# Patient Record
Sex: Female | Born: 1970 | Race: Black or African American | Hispanic: No | Marital: Married | Smoking: Never smoker
Health system: Southern US, Community
[De-identification: ages and names within clinical notes are randomized; demographics above are authoritative.]

## PROBLEM LIST (undated history)

## (undated) DIAGNOSIS — I10 Essential (primary) hypertension: Secondary | ICD-10-CM

## (undated) DIAGNOSIS — G039 Meningitis, unspecified: Secondary | ICD-10-CM

## (undated) HISTORY — DX: Essential (primary) hypertension: I10

## (undated) HISTORY — DX: Meningitis, unspecified: G03.9

## (undated) HISTORY — PX: OTHER SURGICAL HISTORY: SHX169

---
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
## 2000-07-16 ENCOUNTER — Other Ambulatory Visit: Admission: RE | Admit: 2000-07-16 | Discharge: 2000-07-16 | Payer: Self-pay | Admitting: Obstetrics and Gynecology

## 2001-02-09 ENCOUNTER — Inpatient Hospital Stay (HOSPITAL_COMMUNITY): Admission: AD | Admit: 2001-02-09 | Discharge: 2001-02-12 | Payer: Self-pay

## 2001-07-09 ENCOUNTER — Other Ambulatory Visit: Admission: RE | Admit: 2001-07-09 | Discharge: 2001-07-09 | Payer: Self-pay | Admitting: Obstetrics and Gynecology

## 2002-07-09 ENCOUNTER — Other Ambulatory Visit: Admission: RE | Admit: 2002-07-09 | Discharge: 2002-07-09 | Payer: Self-pay | Admitting: Obstetrics and Gynecology

## 2003-07-24 ENCOUNTER — Other Ambulatory Visit: Admission: RE | Admit: 2003-07-24 | Discharge: 2003-07-24 | Payer: Self-pay | Admitting: Obstetrics and Gynecology

## 2004-09-01 ENCOUNTER — Other Ambulatory Visit: Admission: RE | Admit: 2004-09-01 | Discharge: 2004-09-01 | Payer: Self-pay | Admitting: Obstetrics and Gynecology

## 2005-11-15 ENCOUNTER — Other Ambulatory Visit: Admission: RE | Admit: 2005-11-15 | Discharge: 2005-11-15 | Payer: Self-pay | Admitting: Obstetrics and Gynecology

## 2006-10-21 ENCOUNTER — Emergency Department (HOSPITAL_COMMUNITY): Admission: EM | Admit: 2006-10-21 | Discharge: 2006-10-21 | Payer: Self-pay | Admitting: *Deleted
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
## 2010-11-10 LAB — HEPATIC FUNCTION PANEL
ALT: 10
AST: 14
Bilirubin, Direct: <0.1
Total Bilirubin: 0.5

## 2010-11-10 LAB — I-STAT 8, (EC8 V) (CONVERTED LAB)
Acid-base deficit: 2
HCT: 39
Operator id: 257131
Potassium: 3.4 — ABNORMAL LOW
TCO2: 23
pCO2, Ven: 34.4 — ABNORMAL LOW
pH, Ven: 7.412 — ABNORMAL HIGH

## 2010-11-10 LAB — POCT I-STAT CREATININE
Creatinine, Ser: 0.8
Operator id: 257131

## 2010-11-10 LAB — POCT CARDIAC MARKERS: Myoglobin, poc: 40.1

## 2011-03-01 ENCOUNTER — Other Ambulatory Visit (HOSPITAL_COMMUNITY)
Admission: RE | Admit: 2011-03-01 | Discharge: 2011-03-01 | Disposition: A | Discharge status: Home / Self Care | Payer: Self-pay | Source: Ambulatory Visit | Attending: Family Medicine | Admitting: Family Medicine

## 2011-03-01 ENCOUNTER — Other Ambulatory Visit: Payer: Self-pay | Admitting: Physician Assistant

## 2011-03-01 DIAGNOSIS — Z01419 Encounter for gynecological examination (general) (routine) without abnormal findings: Secondary | ICD-10-CM | POA: Insufficient documentation

## 2012-03-01 ENCOUNTER — Other Ambulatory Visit: Payer: Self-pay | Admitting: Physician Assistant

## 2012-03-01 ENCOUNTER — Other Ambulatory Visit (HOSPITAL_COMMUNITY)
Admission: RE | Admit: 2012-03-01 | Discharge: 2012-03-01 | Disposition: A | Discharge status: Home / Self Care | Payer: No Typology Code available for payment source | Source: Ambulatory Visit | Attending: Family Medicine | Admitting: Family Medicine

## 2012-03-01 DIAGNOSIS — Z124 Encounter for screening for malignant neoplasm of cervix: Secondary | ICD-10-CM | POA: Insufficient documentation

## 2013-04-16 ENCOUNTER — Other Ambulatory Visit (HOSPITAL_COMMUNITY)
Admission: RE | Admit: 2013-04-16 | Discharge: 2013-04-16 | Disposition: A | Discharge status: Home / Self Care | Payer: No Typology Code available for payment source | Source: Ambulatory Visit | Attending: Family Medicine | Admitting: Family Medicine

## 2013-04-16 ENCOUNTER — Other Ambulatory Visit: Payer: Self-pay | Admitting: Physician Assistant

## 2013-04-16 DIAGNOSIS — Z124 Encounter for screening for malignant neoplasm of cervix: Secondary | ICD-10-CM | POA: Insufficient documentation

## 2014-04-21 ENCOUNTER — Other Ambulatory Visit: Payer: Self-pay | Admitting: Physician Assistant

## 2014-04-21 ENCOUNTER — Other Ambulatory Visit (HOSPITAL_COMMUNITY)
Admission: RE | Admit: 2014-04-21 | Discharge: 2014-04-21 | Disposition: A | Discharge status: Home / Self Care | Payer: No Typology Code available for payment source | Source: Ambulatory Visit | Attending: Family Medicine | Admitting: Family Medicine

## 2014-04-21 DIAGNOSIS — Z124 Encounter for screening for malignant neoplasm of cervix: Secondary | ICD-10-CM | POA: Insufficient documentation

## 2014-04-23 LAB — CYTOLOGY - PAP

## 2015-01-31 HISTORY — PX: LIPOSUCTION: SHX10

## 2015-04-22 ENCOUNTER — Other Ambulatory Visit: Payer: Self-pay | Admitting: Physician Assistant

## 2015-04-22 ENCOUNTER — Other Ambulatory Visit (HOSPITAL_COMMUNITY)
Admission: RE | Admit: 2015-04-22 | Discharge: 2015-04-22 | Disposition: A | Discharge status: Home / Self Care | Payer: BC Managed Care – PPO | Source: Ambulatory Visit | Attending: Family Medicine | Admitting: Family Medicine

## 2015-04-22 DIAGNOSIS — Z124 Encounter for screening for malignant neoplasm of cervix: Secondary | ICD-10-CM | POA: Diagnosis not present

## 2015-04-27 LAB — CYTOLOGY - PAP

## 2015-05-12 ENCOUNTER — Other Ambulatory Visit: Payer: Self-pay

## 2015-05-12 DIAGNOSIS — Z1231 Encounter for screening mammogram for malignant neoplasm of breast: Secondary | ICD-10-CM

## 2015-05-28 ENCOUNTER — Ambulatory Visit
Admission: RE | Admit: 2015-05-28 | Discharge: 2015-05-28 | Disposition: A | Discharge status: Home / Self Care | Payer: BC Managed Care – PPO | Source: Ambulatory Visit

## 2015-05-28 DIAGNOSIS — Z1231 Encounter for screening mammogram for malignant neoplasm of breast: Secondary | ICD-10-CM

## 2015-06-23 ENCOUNTER — Ambulatory Visit
Admission: RE | Admit: 2015-06-23 | Discharge: 2015-06-23 | Disposition: A | Discharge status: Home / Self Care | Payer: BC Managed Care – PPO | Source: Ambulatory Visit | Attending: Physician Assistant | Admitting: Physician Assistant

## 2015-06-23 ENCOUNTER — Other Ambulatory Visit: Payer: Self-pay | Admitting: Physician Assistant

## 2015-06-23 DIAGNOSIS — R1084 Generalized abdominal pain: Secondary | ICD-10-CM

## 2015-06-23 MED ORDER — IOPAMIDOL (ISOVUE-300) INJECTION 61%
100.0000 mL | Freq: Once | INTRAVENOUS | Status: AC | PRN
  Administered 2015-06-23: 100 mL via INTRAVENOUS

## 2015-06-29 ENCOUNTER — Other Ambulatory Visit: Payer: BC Managed Care – PPO

## 2016-05-03 ENCOUNTER — Other Ambulatory Visit: Payer: Self-pay | Admitting: Physician Assistant

## 2016-05-03 DIAGNOSIS — Z1231 Encounter for screening mammogram for malignant neoplasm of breast: Secondary | ICD-10-CM

## 2016-05-29 ENCOUNTER — Ambulatory Visit
Admission: RE | Admit: 2016-05-29 | Discharge: 2016-05-29 | Disposition: A | Discharge status: Home / Self Care | Payer: BC Managed Care – PPO | Source: Ambulatory Visit | Attending: Physician Assistant | Admitting: Physician Assistant

## 2016-05-29 DIAGNOSIS — Z1231 Encounter for screening mammogram for malignant neoplasm of breast: Secondary | ICD-10-CM

## 2017-05-03 ENCOUNTER — Other Ambulatory Visit: Payer: Self-pay | Admitting: Physician Assistant

## 2017-05-03 DIAGNOSIS — Z1231 Encounter for screening mammogram for malignant neoplasm of breast: Secondary | ICD-10-CM

## 2017-05-30 ENCOUNTER — Ambulatory Visit
Admission: RE | Admit: 2017-05-30 | Discharge: 2017-05-30 | Disposition: A | Discharge status: Home / Self Care | Payer: BC Managed Care – PPO | Source: Ambulatory Visit | Attending: Physician Assistant | Admitting: Physician Assistant

## 2017-05-30 DIAGNOSIS — Z1231 Encounter for screening mammogram for malignant neoplasm of breast: Secondary | ICD-10-CM

## 2017-05-30 IMAGING — MG DIGITAL SCREENING BILATERAL MAMMOGRAM WITH TOMO AND CAD
8 series · 8 of 24 positions shown · non-contrast
Comparison: Previous exam(s).

CLINICAL DATA: Screening.

EXAM:
DIGITAL SCREENING BILATERAL MAMMOGRAM WITH TOMO AND CAD

[L CC synth-2D]
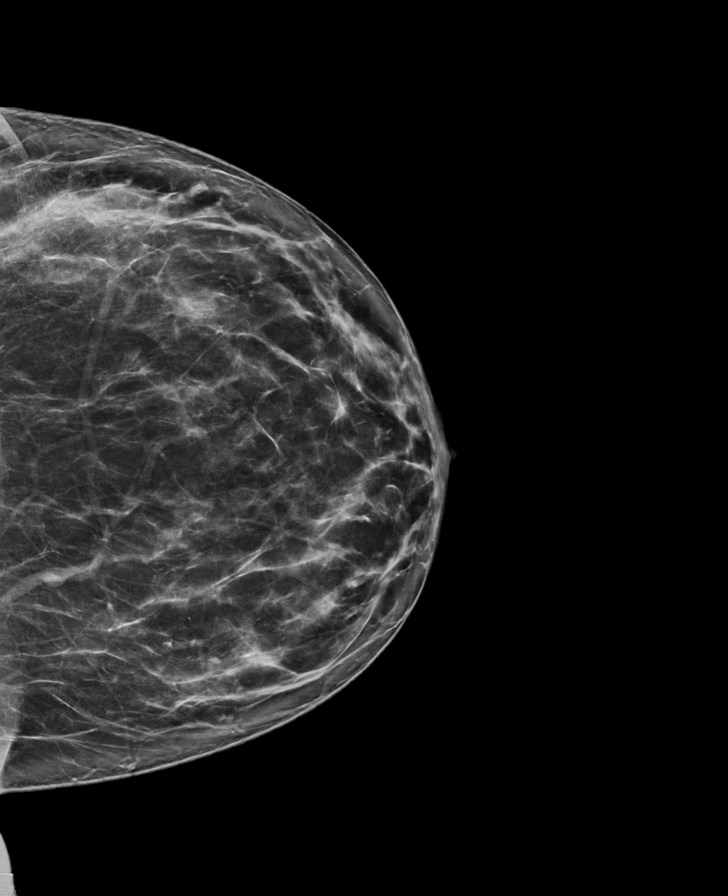

[R CC synth-2D]
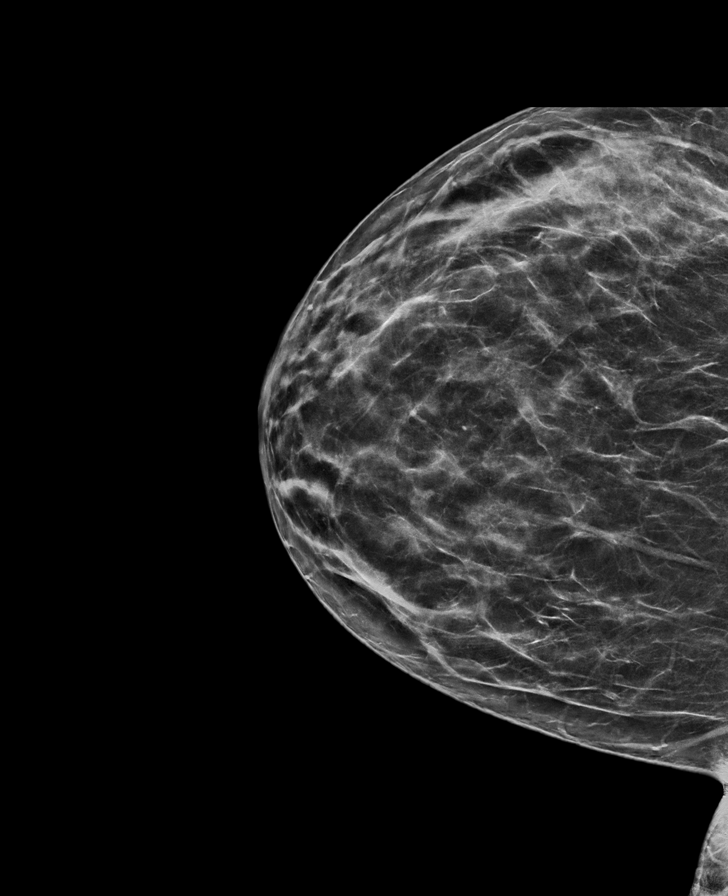

[L MLO synth-2D]
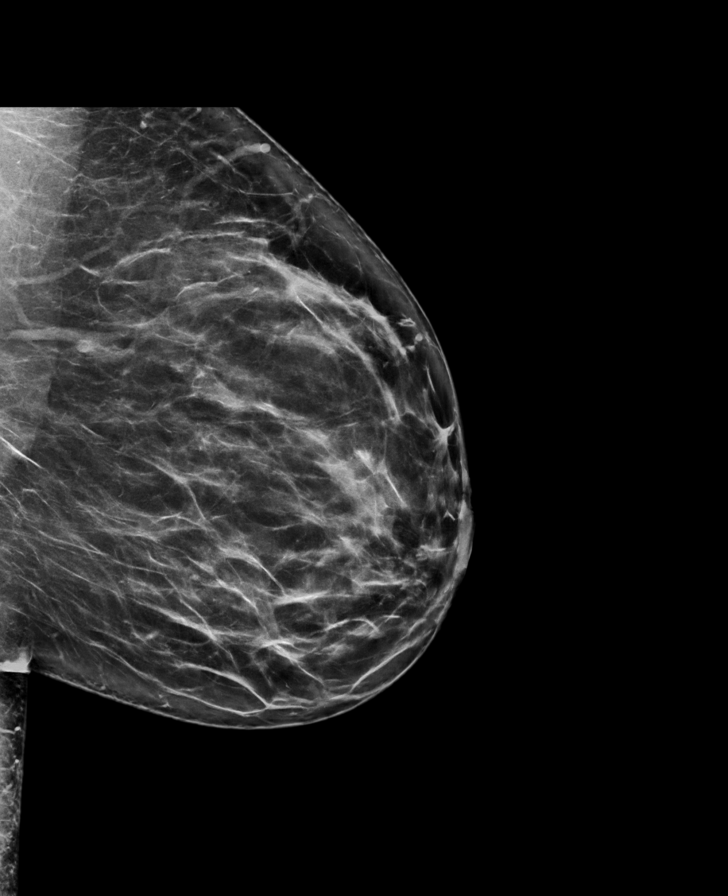

[R MLO synth-2D]
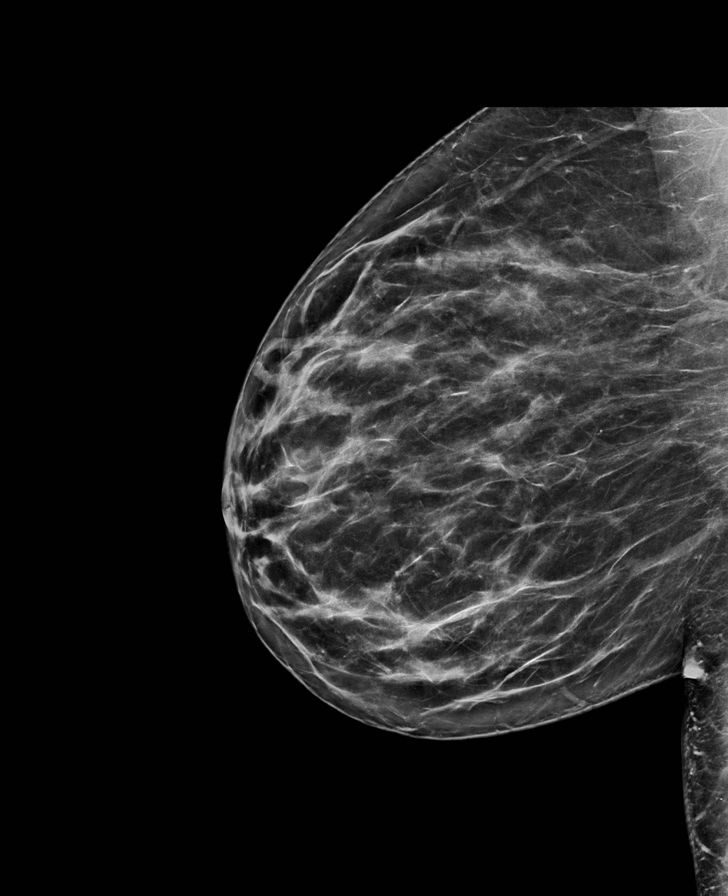

[R CC tomo · tomo slice 38/75.0]
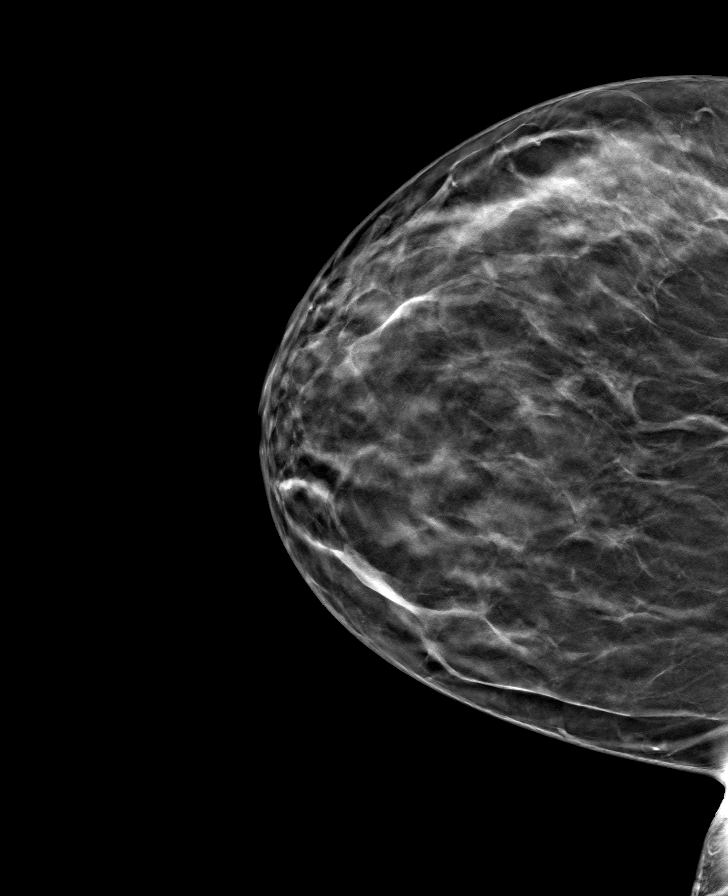

[R MLO tomo · tomo slice 43/84.0]
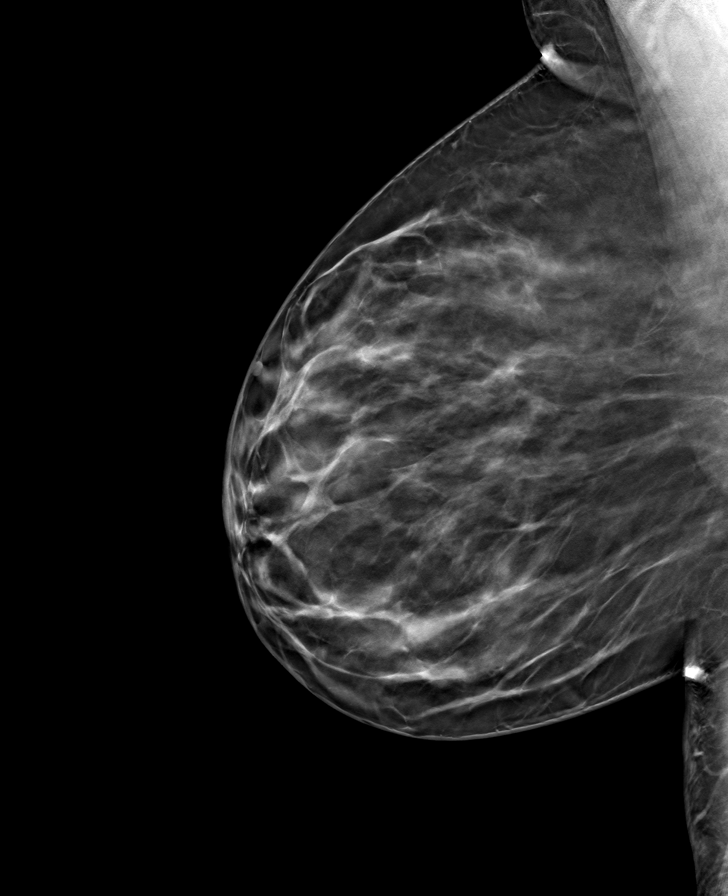

[L CC tomo · tomo slice 42/83.0]
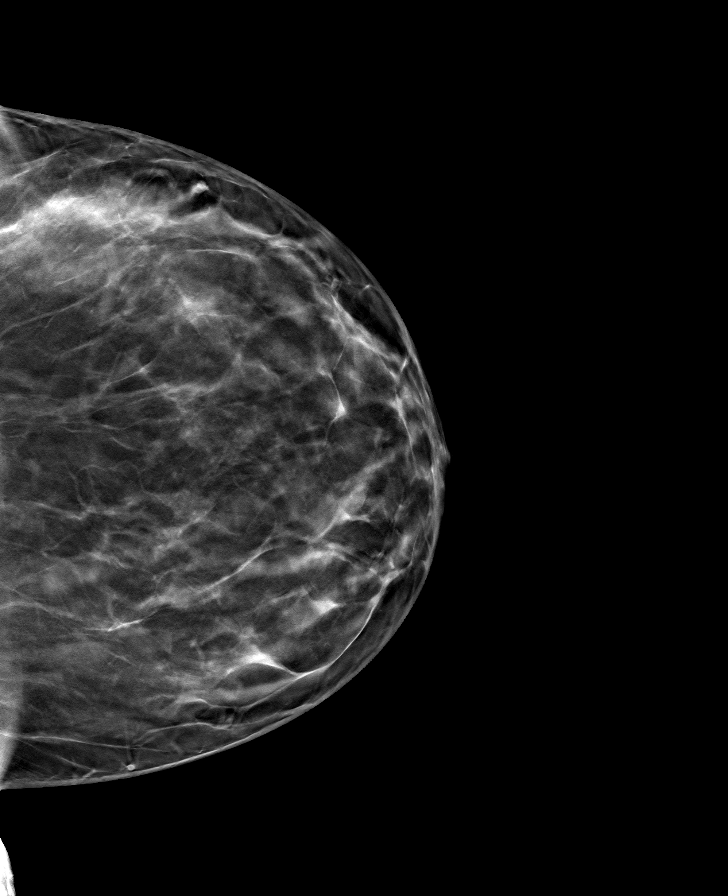

[L MLO tomo · tomo slice 45/89.0]
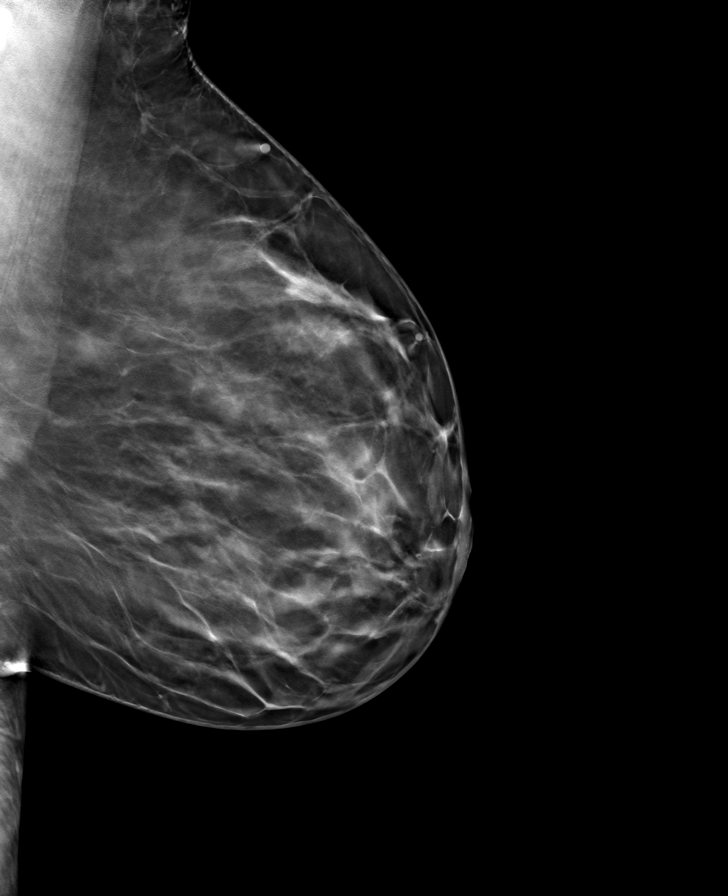

[8 of 24 positions shown; findings below may reference images not displayed]

ACR Breast Density Category c: The breast tissue is heterogeneously
dense, which may obscure small masses.
FINDINGS: There are no findings suspicious for malignancy. Images were
processed with CAD.
IMPRESSION: No mammographic evidence of malignancy. A result letter of this
screening mammogram will be mailed directly to the patient.

RECOMMENDATION:
Screening mammogram in one year. (Code:[5V])

BI-RADS CATEGORY  1: Negative.

## 2017-10-15 ENCOUNTER — Encounter: Payer: Self-pay | Admitting: Allergy

## 2017-10-15 ENCOUNTER — Ambulatory Visit: Payer: BC Managed Care – PPO | Admitting: Allergy

## 2017-10-15 VITALS — BP 126/78 | HR 94 | Temp 98.5°F | Resp 17 | Ht 65.0 in | Wt 219.6 lb

## 2017-10-15 DIAGNOSIS — T781XXA Other adverse food reactions, not elsewhere classified, initial encounter: Secondary | ICD-10-CM

## 2017-10-15 DIAGNOSIS — L508 Other urticaria: Secondary | ICD-10-CM | POA: Diagnosis not present

## 2017-10-15 DIAGNOSIS — T783XXA Angioneurotic edema, initial encounter: Secondary | ICD-10-CM | POA: Diagnosis not present

## 2017-10-15 DIAGNOSIS — J310 Chronic rhinitis: Secondary | ICD-10-CM | POA: Diagnosis not present

## 2017-10-15 MED ORDER — FAMOTIDINE 20 MG PO TABS: 20.0000 mg | ORAL_TABLET | Freq: Two times a day (BID) | ORAL | 5 refills | Status: DC

## 2017-10-15 MED ORDER — MONTELUKAST SODIUM 10 MG PO TABS: 10.0000 mg | ORAL_TABLET | Freq: Every day | ORAL | 5 refills | Status: DC

## 2017-10-15 MED ORDER — FEXOFENADINE HCL 180 MG PO TABS: 180.0000 mg | ORAL_TABLET | Freq: Two times a day (BID) | ORAL | 5 refills | Status: DC

## 2017-10-15 MED ORDER — EPINEPHRINE 0.3 MG/0.3ML IJ SOAJ: 0.3000 mg | Freq: Once | INTRAMUSCULAR | 2 refills | Status: AC

## 2017-10-15 NOTE — Patient Instructions (Signed)
Hives and swelling  - at this time etiology of hives and swelling is unknown.  Hives can be caused by a variety of different triggers including illness/infection, foods, medications, stings, exercise, pressure, vibrations, extremes of temperature to name a few however majority of the time there is no identifiable trigger.  You do have physical component of hives/swelling including exercise and pressure induced.  Your symptoms have been ongoing for >6 weeks making this chronic thus will obtain labwork to evaluate: CBC w diff, CMP, tryptase, hive panel, environmental panel, alpha-gal panel - increase fexofenadine 180mg  to 1 tablet twice a day - start Pepcid 20mg  1 tablet twice a day - start singulair 10mg  1 tablet daily - we did discuss Xolair monthly injections to help with management of hives and swelling if above medications are not effective at controlling hives/swelling episodes.  Brochure provided today.  - for current lip swelling provided with prednisone 20mg  to take for next 3 days.    Nasal drip - as above will obtain environmental allergen panel - for nasal drainage/congestion will have you try Dymista.  Trial dymista 1 spray each nostril twice a day.  This is a combination nasal spray with Flonase + Astelin (nasal antihistamine).  This helps with both nasal congestion and drainage.  If insurance does not cover then will prescribe Astelin separately.   Flonase is OTC nasal steroid spray.    Adverse food reaction - symptoms following [place] nut exposure.  Will obtain serum IgE to tree nut panel. - continue avoidance of [place] nuts - would recommend to have access to an epinephrine device (Epipen or Auviq) in case of more severe reaction if exposure to [place] nuts.   If starting Xolair will need epinephrine device for these injections as well.      Follow-up 2 months or sooner if needed

## 2017-10-15 NOTE — Progress Notes (Signed)
New Patient Note  RE: Selena Collins MRN: [id number] DOB: 1970/04/29 Date of Office Visit: 10/15/2017  Referring provider: Milus Height, PA-C Primary care provider: Milus Height, PA-C  Chief Complaint: Hives  History of present illness: Selena Collins is a 47 y.o. female presenting today for consultation for urticaria and angioedema.  Starting in May 2019 she started developing hives and swelling episodes.   She states she has noticed swelling especially of her lips and periorbitally.  The hives can appear anywhere on the body.  She has been having symptoms pretty much daily. She has noticed several triggers of her hives including exercise.  She has been doing "black girls run" a group that meets and walks/runs in the mornings.  She has noticed that every time she attends these sessions that the hives and swelling seems to appear.  She went early this morning for the walk and afterwards she noticed that her under eye was swollen as well as her lower lip became swollen as well.  She also has noticed that when she becomes overheated or in hot environments and pressure also appeared to be triggers Hives last about a hour or so before resolving.  She does feel she is having some hyperpigmentation on her legs when the hives resolved.  She denies any fevers of the hives.  She denies any joint aches or pains of the hives.  She denies any history of sting's, new foods or change in foods, new medications or change in soaps/lotions/detergents prior to onset of the hives and swelling.  She does state that she has been having some URI symptoms including nasal congestion prior to the onset of the hives and swelling. She states she has tried use of Claritin and more recently has been taking fexofenadine.  She states that she does not take the fexofenadine that the hives and swelling are much worse.  She is just been taking 1 tablet a day.  She also tried taking Benadryl but states that this was not as  effective as the fexofenadine has been.  She has seen her PCP for the hives and swelling and has received per patient a steroid injection.  She states this steroid lasted for about a week before the hives and swelling returned again.  She does report constant nasal drip that she has had for years.  She feels like there is always something in her throat that she is trying to clear out.  She has tried OTC nasal spray that did help but states she does not remember to take this more consistently.    No history of asthma.  She states having eczema during her teen years.   About a year ago she was eating nuts and fruit and she developed lip swelling.   She states when she was a child [place] nut made her mouth itch.  She believes that there may have been traces of [place] nut in this nut mix she was eating a year ago that caused the lip swelling.   She just avoids [place] nuts.  She eats tree nuts/peanuts "all the time" without any issue.  She does not have an epinephrine device.  Review of systems: Review of Systems  Constitutional: Negative for chills, fever and malaise/fatigue.  HENT: Positive for congestion. Negative for ear discharge, ear pain, nosebleeds, sinus pain and sore throat.   Eyes: Negative for pain, discharge and redness.  Respiratory: Negative for cough, shortness of breath and wheezing.   Cardiovascular: Negative for chest  pain.  Gastrointestinal: Negative for abdominal pain, constipation, diarrhea, heartburn, nausea and vomiting.  Musculoskeletal: Negative for joint pain.  Skin: Positive for itching and rash.  Neurological: Negative for headaches.    All other systems negative unless noted above in HPI  Past medical history: Past Medical History:  Diagnosis Date  . Meningitis 1988    Past surgical history: History reviewed. No pertinent surgical history.  Family history:  Family History  Problem Relation Age of Onset  . Urticaria Mother   . Allergic rhinitis Sister      Social history: She lives in a home with carpeting with gas and electric heating and central cooling.  There are no pets in the home.  There is no concern for water damage, mildew or roaches in the home.  She is a Therapist, occupational.  She denies a smoking history.  Medication List: Allergies as of 10/15/2017   No Known Allergies     Medication List        Accurate as of 10/15/17  1:25 PM. Always use your most recent med list.          ADVIL 200 MG tablet Generic drug:  ibuprofen Take 400 mg by mouth every 6 (six) hours as needed.   BENADRYL ALLERGY 25 mg capsule Generic drug:  diphenhydrAMINE Take 25 mg by mouth every 6 (six) hours as needed.   famotidine 20 MG tablet Commonly known as:  PEPCID Take 1 tablet (20 mg total) by mouth 2 (two) times daily.   fexofenadine 180 MG tablet Commonly known as:  ALLEGRA Take 1 tablet (180 mg total) by mouth 2 (two) times daily.   meloxicam 15 MG tablet Commonly known as:  MOBIC TAKE 1 TABLET BY MOUTH ONCE DAILY AS NEEDED WITH FOOD FOR PAIN AND SWELLING   Norgestimate-Ethinyl Estradiol Triphasic 0.18/0.215/0.25 MG-35 MCG tablet Take by mouth.       Known medication allergies: No Known Allergies   Physical examination: Blood pressure 126/78, pulse 94, temperature 98.5 F (36.9 C), temperature source Oral, resp. rate 17, height 5\' 5"  (1.651 m), weight 219 lb 9.6 oz (99.6 kg), SpO2 98 %.  General: Alert, interactive, in no acute distress. HEENT: PERRLA, TMs pearly gray, turbinates mildly edematous without discharge, post-pharynx non erythematous.  There is significant edema of her lower lip concentrated more so on the left lower lip.  There is no oral pharyngeal edema.  There is mild periorbital edema of the lower left lid  Neck: Supple without lymphadenopathy. Lungs: Clear to auscultation without wheezing, rhonchi or rales. {no increased work of breathing. CV: Normal S1, S2 without murmurs. Abdomen: Nondistended,  nontender. Skin: Scattered erythematous urticarial type lesions primarily located Thighs, legs, arms bilaterally , nonvesicular. Extremities:  No clubbing, cyanosis or edema. Neuro:   Grossly intact.  Diagnositics/Labs:  Allergy testing: Deferred due to ongoing urticaria  Assessment and plan:   Chronic urticaria with angioedema - at this time etiology of hives and swelling is unknown.  Hives can be caused by a variety of different triggers including illness/infection, foods, medications, stings, exercise, pressure, vibrations, extremes of temperature to name a few however majority of the time there is no identifiable trigger.  You do have physical component of hives/swelling including exercise and pressure induced.  Your symptoms have been ongoing for >6 weeks making this chronic thus will obtain labwork to evaluate: CBC w diff, CMP, tryptase, hive panel, environmental panel, alpha-gal panel - increase fexofenadine 180mg  to 1 tablet twice a day - start Pepcid 20mg   1 tablet twice a day - start singulair 10mg  1 tablet daily - we did discuss Xolair monthly injections to help with management of hives and swelling if above medications are not effective at controlling hives/swelling episodes.  Brochure provided today.  - for current lip swelling provided with prednisone 20mg  to take for next 3 days.    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  Trial dymista 1 spray each nostril twice a day.  This is a combination nasal spray with Flonase + Astelin (nasal antihistamine).  This helps with both nasal congestion and drainage.  If insurance does not cover then will prescribe Astelin separately.   Flonase is OTC nasal steroid spray.   -Antihistamine as above  Adverse food reaction - symptoms following [place] nut exposure.  Will obtain serum IgE to tree nut panel. - continue avoidance of [place] nuts -  would recommend to have access to an epinephrine device (Epipen or Auviq) in case of more severe reaction if exposure to [place] nuts.   If starting Xolair will need epinephrine device for these injections as well.      Follow-up 2 months or sooner if needed  I appreciate the opportunity to take part in Dandria's care. Please do not hesitate to contact me with questions.  Sincerely,   Margo AyeShaylar Padgett, MD Allergy/Immunology [facility]

## 2017-10-20 LAB — COMPREHENSIVE METABOLIC PANEL
ALBUMIN: 4.4 g/dL (ref 3.5–5.5)
ALK PHOS: 63 IU/L (ref 39–117)
ALT: 10 IU/L (ref 0–32)
AST: 16 IU/L (ref 0–40)
Albumin/Globulin Ratio: 1.5 (ref 1.2–2.2)
BILIRUBIN TOTAL: 0.4 mg/dL (ref 0.0–1.2)
BUN / CREAT RATIO: 12 (ref 9–23)
BUN: 10 mg/dL (ref 6–24)
CO2: 21 mmol/L (ref 20–29)
Calcium: 9.5 mg/dL (ref 8.7–10.2)
Chloride: 103 mmol/L (ref 96–106)
Creatinine, Ser: 0.83 mg/dL (ref 0.57–1.00)
GFR calc Af Amer: 96 mL/min/{1.73_m2} (ref >59)
GFR calc non Af Amer: 84 mL/min/{1.73_m2} (ref >59)
GLUCOSE: 79 mg/dL (ref 65–99)
Globulin, Total: 2.9 g/dL (ref 1.5–4.5)
Potassium: 4.2 mmol/L (ref 3.5–5.2)
SODIUM: 140 mmol/L (ref 134–144)
Total Protein: 7.3 g/dL (ref 6.0–8.5)

## 2017-10-20 LAB — ALPHA-GAL PANEL
Beef (Bos spp) IgE: <0.1 kU/L (ref <0.35)
Class Interpretation: 0
LAMB CLASS INTERPRETATION: 0
Lamb/Mutton (Ovis spp) IgE: <0.1 kU/L (ref <0.35)
PORK CLASS INTERPRETATION: 0

## 2017-10-20 LAB — CBC WITH DIFFERENTIAL/PLATELET
BASOS ABS: 0 10*3/uL (ref 0.0–0.2)
Basos: 0 %
EOS (ABSOLUTE): 0.1 10*3/uL (ref 0.0–0.4)
Eos: 1 %
HEMATOCRIT: 42 % (ref 34.0–46.6)
Hemoglobin: 13.7 g/dL (ref 11.1–15.9)
Immature Grans (Abs): 0 10*3/uL (ref 0.0–0.1)
Immature Granulocytes: 0 %
LYMPHS ABS: 2 10*3/uL (ref 0.7–3.1)
Lymphs: 22 %
MCH: 27.7 pg (ref 26.6–33.0)
MCHC: 32.6 g/dL (ref 31.5–35.7)
MCV: 85 fL (ref 79–97)
Monocytes Absolute: 0.5 10*3/uL (ref 0.1–0.9)
Monocytes: 5 %
NEUTROS PCT: 72 %
Neutrophils Absolute: 6.7 10*3/uL (ref 1.4–7.0)
Platelets: 363 10*3/uL (ref 150–450)
RBC: 4.95 x10E6/uL (ref 3.77–5.28)
RDW: 13.8 % (ref 12.3–15.4)
WBC: 9.4 10*3/uL (ref 3.4–10.8)

## 2017-10-20 LAB — ALLERGENS W/TOTAL IGE AREA 2
Alternaria Alternata IgE: <0.1 kU/L
COCKROACH, GERMAN IGE: 0.16 kU/L — AB
Cladosporium Herbarum IgE: <0.1 kU/L
D Farinae IgE: 16 kU/L — AB
D001-IGE D PTERONYSSINUS: 15.6 kU/L — AB
IGE (IMMUNOGLOBULIN E), SERUM: 121 [IU]/mL (ref 6–495)
Johnson Grass IgE: <0.1 kU/L
Maple/Box Elder IgE: <0.1 kU/L
Oak, White IgE: <0.1 kU/L
Pecan, Hickory IgE: <0.1 kU/L
Penicillium Chrysogen IgE: <0.1 kU/L
Pigweed, Rough IgE: <0.1 kU/L
Ragweed, Short IgE: 0.32 kU/L — AB
Sheep Sorrel IgE Qn: <0.1 kU/L
Timothy Grass IgE: 1.77 kU/L — AB
White Mulberry IgE: <0.1 kU/L

## 2017-10-20 LAB — ALLERGENS(7)
Brazil Nut IgE: <0.1 kU/L
F020-IgE Almond: <0.1 kU/L
Hazelnut (Filbert) IgE: 0.12 kU/L — AB
Walnut IgE: <0.1 kU/L

## 2017-10-20 LAB — THYROID ANTIBODIES
THYROID PEROXIDASE ANTIBODY: 9 [IU]/mL (ref 0–34)
Thyroglobulin Antibody: <1 IU/mL (ref 0.0–0.9)

## 2017-10-20 LAB — CHRONIC URTICARIA: CU INDEX: 32.1 — AB (ref <10)

## 2017-10-20 LAB — TRYPTASE: Tryptase: 5.6 ug/L (ref 2.2–13.2)

## 2017-10-23 ENCOUNTER — Encounter: Payer: Self-pay | Admitting: *Deleted

## 2018-05-01 ENCOUNTER — Other Ambulatory Visit: Payer: Self-pay | Admitting: Physician Assistant

## 2018-05-01 DIAGNOSIS — Z1231 Encounter for screening mammogram for malignant neoplasm of breast: Secondary | ICD-10-CM

## 2018-06-19 ENCOUNTER — Ambulatory Visit
Admission: RE | Admit: 2018-06-19 | Discharge: 2018-06-19 | Disposition: A | Discharge status: Home / Self Care | Payer: BC Managed Care – PPO | Source: Ambulatory Visit | Attending: Physician Assistant | Admitting: Physician Assistant

## 2018-06-19 ENCOUNTER — Other Ambulatory Visit: Payer: Self-pay

## 2018-06-19 DIAGNOSIS — Z1231 Encounter for screening mammogram for malignant neoplasm of breast: Secondary | ICD-10-CM

## 2018-06-21 ENCOUNTER — Ambulatory Visit: Payer: BC Managed Care – PPO

## 2018-07-09 ENCOUNTER — Telehealth: Payer: Self-pay | Admitting: *Deleted

## 2018-07-09 NOTE — Telephone Encounter (Signed)
REFERRAL SENT TO SCHEDULING AND NOTES ON FILE FROM NOELLE [facility] [phone].

## 2018-07-23 ENCOUNTER — Telehealth: Payer: Self-pay

## 2018-07-23 NOTE — Telephone Encounter (Signed)
New Message Left Pt detailed voice mail asking for a return call re:08-09-18 appt. Referring Physician asked for Treadmill appt./reneeval.

## 2018-08-16 ENCOUNTER — Telehealth: Payer: BC Managed Care – PPO | Admitting: Cardiovascular Disease

## 2018-11-04 ENCOUNTER — Other Ambulatory Visit: Payer: Self-pay | Admitting: Registered"

## 2018-11-04 DIAGNOSIS — Z20822 Contact with and (suspected) exposure to covid-19: Secondary | ICD-10-CM

## 2018-11-06 LAB — NOVEL CORONAVIRUS, NAA: SARS-CoV-2, NAA: NOT DETECTED

## 2018-11-08 ENCOUNTER — Ambulatory Visit: Payer: BC Managed Care – PPO | Admitting: Cardiovascular Disease

## 2018-11-10 ENCOUNTER — Other Ambulatory Visit: Payer: Self-pay

## 2018-11-10 ENCOUNTER — Ambulatory Visit
Admission: EM | Admit: 2018-11-10 | Discharge: 2018-11-10 | Disposition: A | Discharge status: Home / Self Care | Payer: BC Managed Care – PPO | Attending: Emergency Medicine | Admitting: Emergency Medicine

## 2018-11-10 DIAGNOSIS — Z20828 Contact with and (suspected) exposure to other viral communicable diseases: Secondary | ICD-10-CM

## 2018-11-10 DIAGNOSIS — R0981 Nasal congestion: Secondary | ICD-10-CM | POA: Diagnosis not present

## 2018-11-10 DIAGNOSIS — M791 Myalgia, unspecified site: Secondary | ICD-10-CM

## 2018-11-10 MED ORDER — FLUTICASONE PROPIONATE 50 MCG/ACT NA SUSP: 1.0000 | Freq: Every day | NASAL | 0 refills | Status: AC

## 2018-11-10 MED ORDER — FEXOFENADINE HCL 180 MG PO TABS: 180.0000 mg | ORAL_TABLET | Freq: Two times a day (BID) | ORAL | 0 refills | Status: DC

## 2018-11-10 MED ORDER — LIDOCAINE VISCOUS HCL 2 % MT SOLN: 15.0000 mL | OROMUCOSAL | 0 refills | Status: DC | PRN

## 2018-11-10 NOTE — ED Triage Notes (Signed)
Pt c/o body aches since yesterday, took Nyquil at 2am this morning. States her husband had a positive COVID test 2wks ago

## 2018-11-10 NOTE — ED Provider Notes (Signed)
[facility]    CSN: [id number] Arrival date & time: 11/10/18  [id number]      History   Chief Complaint Chief Complaint  Patient presents with  . Generalized Body Aches    HPI Selena Collins is a 48 y.o. female presenting for generalized myalgias, nasal congestion, sore throat since yesterday.  Patient tried taking NyQuil early this morning with some relief.  Patient notes that her husband tested positive for COVID 2 weeks ago.  Patient went to [facility]-through facility for COVID testing on 10/5 due to her husband testing positive: Was asymptomatic at that time.     Past Medical History:  Diagnosis Date  . Meningitis 1988    There are no active problems to display for this patient.   History reviewed. No pertinent surgical history.  OB History   No obstetric history on file.      Home Medications    Prior to Admission medications   Medication Sig Start Date End Date Taking? Authorizing Provider  fexofenadine (ALLEGRA) 180 MG tablet Take 1 tablet (180 mg total) by mouth 2 (two) times daily. 11/10/18   Hall-Potvin, [place], PA-C  fluticasone (FLONASE) 50 MCG/ACT nasal spray Place 1 spray into both nostrils daily. 11/10/18   Hall-Potvin, [place], PA-C  ibuprofen (ADVIL) 200 MG tablet Take 400 mg by mouth every 6 (six) hours as needed.    [provider]  lidocaine (XYLOCAINE) 2 % solution Use as directed 15 mLs in the mouth or throat as needed for mouth pain. 11/10/18   Hall-Potvin, [place], PA-C  Norgestimate-Ethinyl Estradiol Triphasic 0.18/0.215/0.25 MG-35 MCG tablet Take by mouth.    [provider]    Family History Family History  Problem Relation Age of Onset  . Urticaria Mother   . Allergic rhinitis Sister     Social History Social History   Tobacco Use  . Smoking status: Never Smoker  . Smokeless tobacco: Never Used  Substance Use Topics  . Alcohol use: Yes  . Drug use: Not on file     Allergies   Patient has no  known allergies.   Review of Systems Review of Systems  Constitutional: Positive for fatigue. Negative for fever.  HENT: Positive for congestion and sore throat. Negative for dental problem, ear pain, facial swelling, hearing loss, sinus pain, trouble swallowing and voice change.   Eyes: Negative for photophobia, pain and visual disturbance.  Respiratory: Negative for cough, shortness of breath and wheezing.   Cardiovascular: Negative for chest pain, palpitations and leg swelling.  Gastrointestinal: Negative for abdominal pain, diarrhea, nausea and vomiting.  Genitourinary: Negative for dysuria, frequency and hematuria.  Musculoskeletal: Positive for myalgias. Negative for arthralgias, back pain, gait problem, neck pain and neck stiffness.  Neurological: Negative for dizziness and headaches.     Physical Exam Triage Vital Signs ED Triage Vitals  Enc Vitals Group     BP 11/10/18 0939 (!) 140/95     Pulse Rate 11/10/18 0939 91     Resp 11/10/18 0939 18     Temp 11/10/18 0939 98.2 F (36.8 C)     Temp Source 11/10/18 0939 Oral     SpO2 11/10/18 0939 96 %     Weight --      Height --      Head Circumference --      Peak Flow --      Pain Score 11/10/18 0940 4     Pain Loc --      Pain Edu? --  Excl. in GC? --    No data found.  Updated Vital Signs BP (!) 140/95 (BP Location: Left Arm)   Pulse 91   Temp 98.2 F (36.8 C) (Oral)   Resp 18   LMP 10/27/2018   SpO2 96%   Visual Acuity Right Eye Distance:   Left Eye Distance:   Bilateral Distance:    Right Eye Near:   Left Eye Near:    Bilateral Near:     Physical Exam Constitutional:      General: She is not in acute distress.    Appearance: She is not toxic-appearing.  HENT:     Head: Normocephalic and atraumatic.     Jaw: There is normal jaw occlusion. No tenderness or pain on movement.     Right Ear: Hearing, tympanic membrane, ear canal and external ear normal. No tenderness. No mastoid tenderness.      Left Ear: Hearing, tympanic membrane, ear canal and external ear normal. No tenderness. No mastoid tenderness.     Nose: No nasal deformity, septal deviation or nasal tenderness.     Right Turbinates: Not swollen or pale.     Left Turbinates: Not swollen or pale.     Right Sinus: No maxillary sinus tenderness or frontal sinus tenderness.     Left Sinus: No maxillary sinus tenderness or frontal sinus tenderness.     Comments: Bilateral turbinate edema with mucosal injection    Mouth/Throat:     Lips: Pink. No lesions.     Mouth: Mucous membranes are moist. No injury.     Pharynx: Oropharynx is clear. Uvula midline. No posterior oropharyngeal erythema or uvula swelling.     Comments: no tonsillar exudate or hypertrophy Eyes:     General: No scleral icterus.    Conjunctiva/sclera: Conjunctivae normal.     Pupils: Pupils are equal, round, and reactive to light.  Neck:     Musculoskeletal: Normal range of motion and neck supple. No muscular tenderness.  Cardiovascular:     Rate and Rhythm: Normal rate and regular rhythm.     Heart sounds: No murmur. No gallop.   Pulmonary:     Effort: Pulmonary effort is normal. No respiratory distress.     Breath sounds: No wheezing.  Musculoskeletal: Normal range of motion.     Comments: Mild tenderness over lower extremities without edema.  Lymphadenopathy:     Cervical: No cervical adenopathy.  Skin:    General: Skin is warm.     Capillary Refill: Capillary refill takes less than 2 seconds.     Findings: No bruising, erythema or rash.  Neurological:     General: No focal deficit present.     Mental Status: She is alert and oriented to person, place, and time.      UC Treatments / Results  Labs (all labs ordered are listed, but only abnormal results are displayed) Labs Reviewed  NOVEL CORONAVIRUS, NAA    EKG   Radiology No results found.  Procedures Procedures (including critical care time)  Medications Ordered in UC Medications  - No data to display  Initial Impression / Assessment and Plan / UC Course  I have reviewed the triage vital signs and the nursing notes.  Pertinent labs & imaging results that were available during my care of the patient were reviewed by me and considered in my medical decision making (see chart for details).     Patient afebrile, no respiratory distress.  Patient nursing upper respiratory symptoms which will be treated  supportively as outlined below.  COVID test pending given patient is now symptomatic with known exposure and is within expected timeframe of symptom onset/incubation (14 days).  Patient to quarantine at home.  Regarding myalgias: These appear to be mild to moderate without severe tenderness or edema on exam.  No previous history of rhabdomyolysis, and patient does not endorse any uremic symptoms-will treat supportively.  Return precautions discussed, patient verbalized understanding and is agreeable to plan. Final Clinical Impressions(s) / UC Diagnoses   Final diagnoses:  Exposure to SARS-associated coronavirus  Nasal congestion  Myalgia     Discharge Instructions     Your COVID test is pending: Is important you quarantine at home until your results are back. You may take OTC Tylenol for fever and myalgias.  It is important to drink plenty of water throughout the day to stay hydrated. If you test positive and later develop severe fever, cough, or shortness of breath, it is recommended that you go to the ER for further evaluation.    ED Prescriptions    Medication Sig Dispense Auth. Provider   fexofenadine (ALLEGRA) 180 MG tablet Take 1 tablet (180 mg total) by mouth 2 (two) times daily. 60 tablet Hall-Potvin, [place], PA-C   fluticasone (FLONASE) 50 MCG/ACT nasal spray Place 1 spray into both nostrils daily. 16 g Hall-Potvin, [place], PA-C   lidocaine (XYLOCAINE) 2 % solution Use as directed 15 mLs in the mouth or throat as needed for mouth pain. 100 mL Hall-Potvin,  [place], PA-C     PDMP not reviewed this encounter.   Hall-Potvin, [place], [place] 11/10/18 1046

## 2018-11-10 NOTE — Discharge Instructions (Signed)
Your COVID test is pending: Is important you quarantine at home until your results are back. °You may take OTC Tylenol for fever and myalgias.  It is important to drink plenty of water throughout the day to stay hydrated. °If you test positive and later develop severe fever, cough, or shortness of breath, it is recommended that you go to the ER for further evaluation. °

## 2018-11-11 ENCOUNTER — Telehealth: Payer: Self-pay | Admitting: Emergency Medicine

## 2018-11-11 LAB — NOVEL CORONAVIRUS, NAA: SARS-CoV-2, NAA: DETECTED — AB

## 2018-11-11 NOTE — Telephone Encounter (Signed)
Left voicemail to check in on patient, and encouraged return call with any continuing questions or concerns.

## 2018-11-12 ENCOUNTER — Telehealth (HOSPITAL_COMMUNITY): Payer: Self-pay | Admitting: Emergency Medicine

## 2018-11-12 NOTE — Telephone Encounter (Signed)
Positive Covid, Patient contacted and made aware of    results, all questions answered Symptoms started 10/10. Fish farm manager work notes sent.

## 2018-11-25 NOTE — Progress Notes (Signed)
Cardiology Office Note:   Date:  11/29/2018  NAME:  Selena Collins    MRN: [id number] DOB:  [birth year]   PCP:  Milus Height, PA-C  Cardiologist:  No primary care provider on file.  Electrophysiologist:  None   Referring MD: Milus Height, PA-C   Chief Complaint  Patient presents with  . Chest Pain   History of Present Illness:   Selena Collins is a 48 y.o. female with a hx of COVID-19 infection who is being seen today for the evaluation of chest pain at the request of Redmon, Noelle, PA-C.  She reports she has had on and off burning sensation in her chest after foods for 10 years.  She reports symptoms have noticeably worsened over the past few months.  She reports when she drinks up to 3 cups of coffee in a row she can get a dull sensation in her left chest.  She also reports certain other foods can do it as well including spicy foods.  She reports the symptoms are concerning her.  They appear to go away without any resolution.  She has never taken any heartburn medicine from what I can tell.  Her blood pressure is slightly elevated today, but she reports anxiety given her Covid questioning to enter the building.  She was recently diagnosed with coronavirus and is still a little upset over all this.  She has no history of diabetes or hyperlipidemia.  She is a never smoker.  Her father did have a history of congestive heart failure, but it appears he had longstanding hypertension.  She reports she started to exercise and is walking up to 2 miles without any limitations.  She reports none of the symptoms of chest pain or chest pressure with exercise.  Past Medical History: Past Medical History:  Diagnosis Date  . Meningitis 1988    Past Surgical History: History reviewed. No pertinent surgical history.  Current Medications: Current Meds  Medication Sig  . fexofenadine (ALLEGRA) 180 MG tablet Take 1 tablet (180 mg total) by mouth 2 (two) times daily.  . fluticasone (FLONASE) 50  MCG/ACT nasal spray Place 1 spray into both nostrils daily.  Marland Kitchen ibuprofen (ADVIL) 200 MG tablet Take 400 mg by mouth every 6 (six) hours as needed.  . lidocaine (XYLOCAINE) 2 % solution Use as directed 15 mLs in the mouth or throat as needed for mouth pain.  . Norgestimate-Ethinyl Estradiol Triphasic 0.18/0.215/0.25 MG-35 MCG tablet Take by mouth.     Allergies:    Dust mite extract   Social History: Social History   Socioeconomic History  . Marital status: Married    Spouse name: Not on file  . Number of children: 2  . Years of education: Not on file  . Highest education level: Not on file  Occupational History  . Not on file  Social Needs  . Financial resource strain: Not on file  . Food insecurity    Worry: Not on file    Inability: Not on file  . Transportation needs    Medical: Not on file    Non-medical: Not on file  Tobacco Use  . Smoking status: Never Smoker  . Smokeless tobacco: Never Used  Substance and Sexual Activity  . Alcohol use: Yes  . Drug use: Not on file  . Sexual activity: Not on file  Lifestyle  . Physical activity    Days per week: Not on file    Minutes per session: Not on file  .  Stress: Not on file  Relationships  . Social Musicianconnections    Talks on phone: Not on file    Gets together: Not on file    Attends religious service: Not on file    Active member of club or organization: Not on file    Attends meetings of clubs or organizations: Not on file    Relationship status: Not on file  Other Topics Concern  . Not on file  Social History Narrative   Magistrate     Family History: The patient's family history includes Allergic rhinitis in her sister; Brain cancer in her mother; Heart failure (age of onset: 760) in her father; Hypertension in her father and sister; Urticaria in her mother.  ROS:   All other ROS reviewed and negative. Pertinent positives noted in the HPI.     EKGs/Labs/Other Studies Reviewed:   The following studies were  personally reviewed by me today:  EKG:  EKG is ordered today.  The ekg ordered today demonstrates normal sinus rhythm, heart rate 81, RSR prime pattern, normal variant, no acute ST-T changes, no evidence of prior infarction, and was personally reviewed by me.   Recent Labs: No results found for requested labs within last 8760 hours.   Recent Lipid Panel No results found for: CHOL, TRIG, HDL, CHOLHDL, VLDL, LDLCALC, LDLDIRECT  Physical Exam:   VS:  BP (!) 166/98 (BP Location: Right Arm)   Pulse 80   Temp (!) 97.3 F (36.3 C)   Ht 5\' 5"  (1.651 m)   Wt 220 lb 3.2 oz (99.9 kg)   SpO2 97%   BMI 36.64 kg/m    Wt Readings from Last 3 Encounters:  11/29/18 220 lb 3.2 oz (99.9 kg)  10/15/17 219 lb 9.6 oz (99.6 kg)    General: Well nourished, well developed, in no acute distress Heart: Atraumatic, normal size  Eyes: PEERLA, EOMI  Neck: Supple, no JVD Endocrine: No thryomegaly Cardiac: Normal S1, S2; RRR; no murmurs, rubs, or gallops Lungs: Clear to auscultation bilaterally, no wheezing, rhonchi or rales  Abd: Soft, nontender, no hepatomegaly  Ext: No edema, pulses 2+ Musculoskeletal: No deformities, BUE and BLE strength normal and equal Skin: Warm and dry, no rashes   Neuro: Alert and oriented to person, place, time, and situation, CNII-XII grossly intact, no focal deficits  Psych: Normal mood and affect   ASSESSMENT:   Selena Collins is a 48 y.o. female who presents for the following: 1. Chest pain of uncertain etiology   2. Gastroesophageal reflux disease without esophagitis   3. Blood pressure elevated without history of HTN     PLAN:   1. Chest pain of uncertain etiology 2. Gastroesophageal reflux disease without esophagitis -She presents with on and off chest pain that is more consistent with GERD.  Her EKG is normal with an RSR prime pattern which is very normal.  She has no evidence of a murmur on exam.  She is able to exercise up to 2 miles without any limitations.  I  think given the clear association with food I will start her on Prilosec 40 mg daily and see her back in about 3 months to determine if her symptoms have improved.  Overall, she is low risk for any cardiovascular disease and her symptoms are not concerning for cardiac etiology.  3. Blood pressure elevated without history of HTN -Blood pressure a bit elevated today, but she reports that it is within normal limits when seen her primary care physician.  We will keep an eye on this and when I see her back in 3 months we can determine if we need to do anything about this.  4. Morbid obesity ([place]) -Counseled on the importance of diet and exercise.  She is going to work on losing weight and improving diet.   Disposition: Return in about 3 months (around 03/01/2019).  Medication Adjustments/Labs and Tests Ordered: Current medicines are reviewed at length with the patient today.  Concerns regarding medicines are outlined above.  No orders of the defined types were placed in this encounter.  No orders of the defined types were placed in this encounter.   Patient Instructions  Medication Instructions:   try taking Prilosec 40 mg  in the morning before Breakfast   *If you need a refill on your cardiac medications before your next appointment, please call your pharmacy*  Lab Work: Not needed   Testing/Procedures:  Not needed  Follow-Up: At [facility], you and your health needs are our priority.  As part of our continuing mission to provide you with exceptional heart care, we have created designated Provider Care Teams.  These Care Teams include your primary Cardiologist (physician) and Advanced Practice Providers (APPs -  Physician Assistants and Nurse Practitioners) who all work together to provide you with the care you need, when you need it.  Your next appointment:   3 months  The format for your next appointment:   In Person  Provider:   Eleonore Chiquito, MD  Other Instructions   continue with exercise    Signed, Addison Naegeli. Audie Box, [place]  [address] [place],  [address] [phone]  11/29/2018 11:10 AM

## 2018-11-29 ENCOUNTER — Other Ambulatory Visit: Payer: Self-pay

## 2018-11-29 ENCOUNTER — Encounter: Payer: Self-pay | Admitting: Cardiovascular Disease

## 2018-11-29 ENCOUNTER — Ambulatory Visit: Payer: BC Managed Care – PPO | Admitting: Cardiovascular Disease

## 2018-11-29 VITALS — BP 166/98 | HR 80 | Temp 97.3°F | Ht 65.0 in | Wt 220.2 lb

## 2018-11-29 DIAGNOSIS — R030 Elevated blood-pressure reading, without diagnosis of hypertension: Secondary | ICD-10-CM | POA: Diagnosis not present

## 2018-11-29 DIAGNOSIS — R079 Chest pain, unspecified: Secondary | ICD-10-CM

## 2018-11-29 DIAGNOSIS — K219 Gastro-esophageal reflux disease without esophagitis: Secondary | ICD-10-CM | POA: Diagnosis not present

## 2018-11-29 MED ORDER — OMEPRAZOLE 40 MG PO CPDR: 40.0000 mg | DELAYED_RELEASE_CAPSULE | ORAL | 3 refills | Status: DC

## 2018-11-29 NOTE — Patient Instructions (Signed)
Medication Instructions:   try taking Prilosec 40 mg  in the morning before Breakfast   *If you need a refill on your cardiac medications before your next appointment, please call your pharmacy*  Lab Work: Not needed   Testing/Procedures:  Not needed  Follow-Up: At [facility], you and your health needs are our priority.  As part of our continuing mission to provide you with exceptional heart care, we have created designated Provider Care Teams.  These Care Teams include your primary Cardiologist (physician) and Advanced Practice Providers (APPs -  Physician Assistants and Nurse Practitioners) who all work together to provide you with the care you need, when you need it.  Your next appointment:   3 months  The format for your next appointment:   In Person  Provider:   Eleonore Chiquito, MD  Other Instructions  continue with exercise

## 2018-12-13 ENCOUNTER — Ambulatory Visit: Payer: BC Managed Care – PPO | Admitting: Cardiovascular Disease

## 2019-03-12 NOTE — Progress Notes (Signed)
Cardiology Office Note:   Date:  03/14/2019  NAME:  Selena Collins    MRN: [id number] DOB:  1970-04-23   PCP:  Milus Height, PA  Cardiologist:  No primary care provider on file.   Referring MD: Milus Height, PA   Chief Complaint  Patient presents with   Gastroesophageal Reflux    History of Present Illness:   Selena Collins is a 49 y.o. female with a hx of COVID-19 infection who presents for follow-up of GERD.  She was evaluated on 11/29/2018 with chest pain that likely was attributed to GERD.  She was able to exercise up to 2 miles without any limitations.  She was started on Prilosec and presents for follow-up today. She reports she is doing well.  Her symptoms of chest pain that worse with eating certain foods has improved with Prilosec.  She reports last week she had an episode of left-sided chest pain described as a throbbing achy pain.  The pain also went to her right chest.  She reports this was associated with cheating on her acid reflux diet.  She reports the pain was not worse with exertion and alleviated by rest.  She has not been exercising as much as used to.  She reports she is exercising once per week walking about a mile at a time.  She reports no chest pain or shortness of breath with this.  She reports any heavy exertion can get her out of breath but she reports she is likely deconditioned.  Her blood pressure today is 152/82.  We saw her last it was 166/98.  She does have a family history of hypertension.  She reports she has not seen her primary care physician in person since last year.  I informed her that it probably would be a good idea to start medication.  She has also gained a bit of weight since her last visit.  She reports that she needs to work on diet and exercise more.  Past Medical History: Past Medical History:  Diagnosis Date   Meningitis 1988    Past Surgical History: History reviewed. No pertinent surgical history.  Current  Medications: Current Meds  Medication Sig   fexofenadine (ALLEGRA) 180 MG tablet Take 1 tablet (180 mg total) by mouth 2 (two) times daily.   fluticasone (FLONASE) 50 MCG/ACT nasal spray Place 1 spray into both nostrils daily.   ibuprofen (ADVIL) 200 MG tablet Take 400 mg by mouth every 6 (six) hours as needed.   Norgestimate-Ethinyl Estradiol Triphasic 0.18/0.215/0.25 MG-35 MCG tablet Take by mouth.     Allergies:    Dust mite extract   Social History: Social History   Socioeconomic History   Marital status: Married    Spouse name: Not on file   Number of children: 2   Years of education: Not on file   Highest education level: Not on file  Occupational History   Not on file  Tobacco Use   Smoking status: Never Smoker   Smokeless tobacco: Never Used  Substance and Sexual Activity   Alcohol use: Yes   Drug use: Not on file   Sexual activity: Not on file  Other Topics Concern   Not on file  Social History Narrative   Magistrate   Social Determinants of Health   Financial Resource Strain:    Difficulty of Paying Living Expenses: Not on file  Food Insecurity:    Worried About Running Out of Food in the Last Year: Not on file  Ran Out of Food in the Last Year: Not on file  Transportation Needs:    Lack of Transportation (Medical): Not on file   Lack of Transportation (Non-Medical): Not on file  Physical Activity:    Days of Exercise per Week: Not on file   Minutes of Exercise per Session: Not on file  Stress:    Feeling of Stress : Not on file  Social Connections:    Frequency of Communication with Friends and Family: Not on file   Frequency of Social Gatherings with Friends and Family: Not on file   Attends Religious Services: Not on file   Active Member of Clubs or Organizations: Not on file   Attends Banker Meetings: Not on file   Marital Status: Not on file     Family History: The patient's family history includes  Allergic rhinitis in her sister; Brain cancer in her mother; Heart failure (age of onset: 75) in her father; Hypertension in her father and sister; Urticaria in her mother.  ROS:   All other ROS reviewed and negative. Pertinent positives noted in the HPI.     EKGs/Labs/Other Studies Reviewed:   The following studies were personally reviewed by me today:   Recent Labs: No results found for requested labs within last 8760 hours.   Recent Lipid Panel No results found for: CHOL, TRIG, HDL, CHOLHDL, VLDL, LDLCALC, LDLDIRECT  Physical Exam:   VS:  BP (!) 152/82    Pulse 81    Ht 5\' 3"  (1.6 m)    Wt 227 lb 6.4 oz (103.1 kg)    SpO2 98%    BMI 40.28 kg/m    Wt Readings from Last 3 Encounters:  03/14/19 227 lb 6.4 oz (103.1 kg)  11/29/18 220 lb 3.2 oz (99.9 kg)  10/15/17 219 lb 9.6 oz (99.6 kg)    General: Well nourished, well developed, in no acute distress Heart: Atraumatic, normal size  Eyes: PEERLA, EOMI  Neck: Supple, no JVD Endocrine: No thryomegaly Cardiac: Normal S1, S2; RRR; no murmurs, rubs, or gallops Lungs: Clear to auscultation bilaterally, no wheezing, rhonchi or rales  Abd: Soft, nontender, no hepatomegaly  Ext: No edema, pulses 2+ Musculoskeletal: No deformities, BUE and BLE strength normal and equal Skin: Warm and dry, no rashes   Neuro: Alert and oriented to person, place, time, and situation, CNII-XII grossly intact, no focal deficits  Psych: Normal mood and affect   ASSESSMENT:   Selena Collins is a 49 y.o. female who presents for the following: 1. Chest pain, unspecified type   2. Gastroesophageal reflux disease without esophagitis   3. Essential hypertension   4. Obesity, morbid, BMI 40.0-49.9 (HCC)     PLAN:   1. Chest pain, unspecified type 2. Gastroesophageal reflux disease without esophagitis -Atypical chest pain.  Improved with Prilosec.  She will continue this.  She will continue to modify her diet.  She was counseled how to take Prilosec  correctly.  30 minutes before she eats anything with 8 ounces of water.  3. Essential hypertension -Blood pressure elevated today.  Was elevated on her last visit.  Given her family history of hypertension and father and sister, we will start her on amlodipine 10 mg daily.  She will follow with her primary care physician for further management of this.  4. Obesity, morbid, BMI 40.0-49.9 (HCC) -Counseled on importance of diet and exercise.  Disposition: Return if symptoms worsen or fail to improve.  Medication Adjustments/Labs and Tests Ordered: Current  medicines are reviewed at length with the patient today.  Concerns regarding medicines are outlined above.  No orders of the defined types were placed in this encounter.  Meds ordered this encounter  Medications   amLODipine (NORVASC) 10 MG tablet    Sig: Take 1 tablet (10 mg total) by mouth daily.    Dispense:  90 tablet    Refill:  0    Patient Instructions  Medication Instructions:  Start Amlodipine 10 mg daily  *If you need a refill on your cardiac medications before your next appointment, please call your pharmacy*  Follow-Up: At [facility], you and your health needs are our priority.  As part of our continuing mission to provide you with exceptional heart care, we have created designated Provider Care Teams.  These Care Teams include your primary Cardiologist (physician) and Advanced Practice Providers (APPs -  Physician Assistants and Nurse Practitioners) who all work together to provide you with the care you need, when you need it.  Your next appointment:   As needed  The format for your next appointment:   Either In Person or Virtual  Provider:   Eleonore Chiquito, MD        Signed, Addison Naegeli. Audie Box, [facility]  [address] [place], [place] [address] [phone]  03/14/2019 8:39 AM

## 2019-03-14 ENCOUNTER — Ambulatory Visit: Payer: BC Managed Care – PPO | Admitting: Cardiovascular Disease

## 2019-03-14 ENCOUNTER — Encounter: Payer: Self-pay | Admitting: Cardiovascular Disease

## 2019-03-14 ENCOUNTER — Other Ambulatory Visit: Payer: Self-pay

## 2019-03-14 VITALS — BP 152/82 | HR 81 | Ht 63.0 in | Wt 227.4 lb

## 2019-03-14 DIAGNOSIS — I10 Essential (primary) hypertension: Secondary | ICD-10-CM

## 2019-03-14 DIAGNOSIS — K219 Gastro-esophageal reflux disease without esophagitis: Secondary | ICD-10-CM | POA: Diagnosis not present

## 2019-03-14 DIAGNOSIS — R079 Chest pain, unspecified: Secondary | ICD-10-CM

## 2019-03-14 MED ORDER — AMLODIPINE BESYLATE 10 MG PO TABS: 10.0000 mg | ORAL_TABLET | Freq: Every day | ORAL | 0 refills | Status: DC

## 2019-03-14 NOTE — Patient Instructions (Signed)
Medication Instructions:  Start Amlodipine 10 mg daily  *If you need a refill on your cardiac medications before your next appointment, please call your pharmacy*  Follow-Up: At [facility], you and your health needs are our priority.  As part of our continuing mission to provide you with exceptional heart care, we have created designated Provider Care Teams.  These Care Teams include your primary Cardiologist (physician) and Advanced Practice Providers (APPs -  Physician Assistants and Nurse Practitioners) who all work together to provide you with the care you need, when you need it.  Your next appointment:   As needed  The format for your next appointment:   Either In Person or Virtual  Provider:   Lennie Odor, MD

## 2019-07-07 ENCOUNTER — Other Ambulatory Visit (HOSPITAL_COMMUNITY)
Admission: RE | Admit: 2019-07-07 | Discharge: 2019-07-07 | Disposition: A | Discharge status: Home / Self Care | Payer: BC Managed Care – PPO | Source: Ambulatory Visit | Attending: Physician Assistant | Admitting: Physician Assistant

## 2019-07-07 ENCOUNTER — Other Ambulatory Visit: Payer: Self-pay | Admitting: Physician Assistant

## 2019-07-07 DIAGNOSIS — Z124 Encounter for screening for malignant neoplasm of cervix: Secondary | ICD-10-CM | POA: Diagnosis present

## 2019-07-09 LAB — CYTOLOGY - PAP
Adequacy: ABSENT
Diagnosis: NEGATIVE

## 2019-10-09 ENCOUNTER — Other Ambulatory Visit: Payer: Self-pay | Admitting: Physician Assistant

## 2019-10-09 DIAGNOSIS — Z1231 Encounter for screening mammogram for malignant neoplasm of breast: Secondary | ICD-10-CM

## 2019-10-24 ENCOUNTER — Ambulatory Visit
Admission: RE | Admit: 2019-10-24 | Discharge: 2019-10-24 | Disposition: A | Discharge status: Home / Self Care | Payer: BC Managed Care – PPO | Source: Ambulatory Visit | Attending: Physician Assistant | Admitting: Physician Assistant

## 2019-10-24 ENCOUNTER — Other Ambulatory Visit: Payer: Self-pay

## 2019-10-24 DIAGNOSIS — Z1231 Encounter for screening mammogram for malignant neoplasm of breast: Secondary | ICD-10-CM

## 2021-04-06 ENCOUNTER — Other Ambulatory Visit: Payer: Self-pay | Admitting: Physician Assistant

## 2021-04-06 DIAGNOSIS — Z1231 Encounter for screening mammogram for malignant neoplasm of breast: Secondary | ICD-10-CM

## 2021-04-08 ENCOUNTER — Ambulatory Visit
Admission: RE | Admit: 2021-04-08 | Discharge: 2021-04-08 | Disposition: A | Discharge status: Home / Self Care | Payer: BC Managed Care – PPO | Source: Ambulatory Visit | Attending: Physician Assistant | Admitting: Physician Assistant

## 2021-04-08 DIAGNOSIS — Z1231 Encounter for screening mammogram for malignant neoplasm of breast: Secondary | ICD-10-CM

## 2021-04-29 ENCOUNTER — Ambulatory Visit: Payer: BC Managed Care – PPO

## 2022-07-03 ENCOUNTER — Other Ambulatory Visit: Payer: Self-pay | Admitting: Obstetrics and Gynecology

## 2022-07-03 DIAGNOSIS — R928 Other abnormal and inconclusive findings on diagnostic imaging of breast: Secondary | ICD-10-CM

## 2022-07-15 ENCOUNTER — Ambulatory Visit: Payer: BC Managed Care – PPO

## 2022-07-15 ENCOUNTER — Ambulatory Visit
Admission: RE | Admit: 2022-07-15 | Discharge: 2022-07-15 | Disposition: A | Discharge status: Home / Self Care | Payer: BC Managed Care – PPO | Source: Ambulatory Visit | Attending: Obstetrics and Gynecology | Admitting: Obstetrics and Gynecology

## 2022-07-15 DIAGNOSIS — R928 Other abnormal and inconclusive findings on diagnostic imaging of breast: Secondary | ICD-10-CM

## 2023-01-21 ENCOUNTER — Emergency Department (HOSPITAL_COMMUNITY): Payer: BC Managed Care – PPO

## 2023-01-21 ENCOUNTER — Other Ambulatory Visit: Payer: Self-pay

## 2023-01-21 ENCOUNTER — Emergency Department (HOSPITAL_COMMUNITY)
Admission: EM | Admit: 2023-01-21 | Discharge: 2023-01-21 | Disposition: A | Discharge status: Home / Self Care | Payer: BC Managed Care – PPO | Attending: Emergency Medicine | Admitting: Emergency Medicine

## 2023-01-21 DIAGNOSIS — N838 Other noninflammatory disorders of ovary, fallopian tube and broad ligament: Secondary | ICD-10-CM

## 2023-01-21 DIAGNOSIS — N83291 Other ovarian cyst, right side: Secondary | ICD-10-CM | POA: Insufficient documentation

## 2023-01-21 DIAGNOSIS — R1031 Right lower quadrant pain: Secondary | ICD-10-CM | POA: Diagnosis present

## 2023-01-21 LAB — URINALYSIS, ROUTINE W REFLEX MICROSCOPIC
Bilirubin Urine: NEGATIVE
Glucose, UA: NEGATIVE mg/dL
Ketones, ur: NEGATIVE mg/dL
Leukocytes,Ua: NEGATIVE
Nitrite: NEGATIVE
Protein, ur: NEGATIVE mg/dL
Specific Gravity, Urine: 1.008 (ref 1.005–1.030)
pH: 6 (ref 5.0–8.0)

## 2023-01-21 LAB — CBC WITH DIFFERENTIAL/PLATELET
Abs Immature Granulocytes: 0.02 10*3/uL (ref 0.00–0.07)
Basophils Absolute: 0 10*3/uL (ref 0.0–0.1)
Basophils Relative: 1 %
Eosinophils Absolute: 0.3 10*3/uL (ref 0.0–0.5)
Eosinophils Relative: 4 %
HCT: 38.3 % (ref 36.0–46.0)
Hemoglobin: 12.6 g/dL (ref 12.0–15.0)
Immature Granulocytes: 0 %
Lymphocytes Relative: 40 %
Lymphs Abs: 2.7 10*3/uL (ref 0.7–4.0)
MCH: 28.2 pg (ref 26.0–34.0)
MCHC: 32.9 g/dL (ref 30.0–36.0)
MCV: 85.7 fL (ref 80.0–100.0)
Monocytes Absolute: 0.5 10*3/uL (ref 0.1–1.0)
Monocytes Relative: 7 %
Neutro Abs: 3.4 10*3/uL (ref 1.7–7.7)
Neutrophils Relative %: 48 %
Platelets: 355 10*3/uL (ref 150–400)
RBC: 4.47 MIL/uL (ref 3.87–5.11)
RDW: 14.2 % (ref 11.5–15.5)
WBC: 6.9 10*3/uL (ref 4.0–10.5)
nRBC: 0 % (ref 0.0–0.2)

## 2023-01-21 LAB — LIPASE, BLOOD: Lipase: 33 U/L (ref 11–51)

## 2023-01-21 LAB — COMPREHENSIVE METABOLIC PANEL
ALT: 23 U/L (ref 0–44)
AST: 25 U/L (ref 15–41)
Albumin: 3.9 g/dL (ref 3.5–5.0)
Alkaline Phosphatase: 66 U/L (ref 38–126)
Anion gap: 10 (ref 5–15)
BUN: 10 mg/dL (ref 6–20)
CO2: 25 mmol/L (ref 22–32)
Calcium: 9.1 mg/dL (ref 8.9–10.3)
Chloride: 105 mmol/L (ref 98–111)
Creatinine, Ser: 0.8 mg/dL (ref 0.44–1.00)
GFR, Estimated: >60 mL/min (ref >60)
Glucose, Bld: 89 mg/dL (ref 70–99)
Potassium: 3.4 mmol/L — ABNORMAL LOW (ref 3.5–5.1)
Sodium: 140 mmol/L (ref 135–145)
Total Bilirubin: 0.6 mg/dL (ref <1.2)
Total Protein: 7.2 g/dL (ref 6.5–8.1)

## 2023-01-21 LAB — HCG, SERUM, QUALITATIVE: Preg, Serum: NEGATIVE

## 2023-01-21 MED ORDER — IOHEXOL 350 MG/ML SOLN
75.0000 mL | Freq: Once | INTRAVENOUS | Status: AC | PRN
  Administered 2023-01-21: 75 mL via INTRAVENOUS

## 2023-01-21 MED ORDER — ONDANSETRON 4 MG PO TBDP: ORAL_TABLET | ORAL | 0 refills | Status: AC

## 2023-01-21 NOTE — ED Triage Notes (Signed)
Pt complains of RLQ pain and nausea on and off x 1 month. Pt states it felt like period cramps. Pt went to urgent care today and was told they felt a mass and needed to come to ER for further evaluation. Pt states pain woke her up this morning. Took gas pill this morning with a little relief. Denies any diarrhea or vomiting.

## 2023-01-21 NOTE — ED Notes (Signed)
Discharge instructions reviewed with patient. Patient questions answered and opportunity for education reviewed. Patient voices understanding of discharge instructions with no further questions. Patient ambulatory with steady gait to lobby.  

## 2023-01-21 NOTE — ED Provider Notes (Signed)
[place] EMERGENCY DEPARTMENT AT [facility] Provider Note   CSN: [id number] Arrival date & time: 01/21/23  1419     History  Chief Complaint  Patient presents with   Abdominal Pain    Emilya EURIKA SCHAMBACH is a 52 y.o. female.  Patient is a 52 year old who presents with pain in her right lower abdomen.  She states she had an episode of this about a month ago.  It went away on its own.  She had some nausea associated with it.  However it only lasted a few hours.  She had another episode of the pain this past Thursday but it went away again.  Today it started last night and has been persistent.  It is in her right lower abdomen.  It goes around to her back little bit.  She has had some nausea but no vomiting.  No fevers.  She says it is a little bit worse with urination.  Is not worse with eating.  She denies any vaginal bleeding or discharge.  She is perimenopausal and her last menstrual cycle was in July.  No history of similar symptoms in the past.       Home Medications Prior to Admission medications   Medication Sig Start Date End Date Taking? Authorizing Provider  amLODipine (NORVASC) 10 MG tablet Take 1 tablet (10 mg total) by mouth daily. 03/14/19 06/12/19  O'NealRonnald Ramp, MD  fexofenadine (ALLEGRA) 180 MG tablet Take 1 tablet (180 mg total) by mouth 2 (two) times daily. 11/10/18   Hall-Potvin, [place], PA-C  fluticasone (FLONASE) 50 MCG/ACT nasal spray Place 1 spray into both nostrils daily. 11/10/18   Hall-Potvin, [place], PA-C  ibuprofen (ADVIL) 200 MG tablet Take 400 mg by mouth every 6 (six) hours as needed.    [provider]  lidocaine (XYLOCAINE) 2 % solution Use as directed 15 mLs in the mouth or throat as needed for mouth pain. Patient not taking: Reported on 03/14/2019 11/10/18   Hall-Potvin, [place], PA-C  Norgestimate-Ethinyl Estradiol Triphasic 0.18/0.215/0.25 MG-35 MCG tablet Take by mouth.    [provider]  omeprazole  (PRILOSEC) 40 MG capsule Take 1 capsule (40 mg total) by mouth every morning. Patient not taking: Reported on 03/14/2019 11/29/18   Sande Rives, MD      Allergies    Dust mite extract    Review of Systems   Review of Systems  Constitutional:  Negative for chills, diaphoresis, fatigue and fever.  HENT:  Negative for congestion, rhinorrhea and sneezing.   Eyes: Negative.   Respiratory:  Negative for cough, chest tightness and shortness of breath.   Cardiovascular:  Negative for chest pain and leg swelling.  Gastrointestinal:  Positive for abdominal pain and nausea. Negative for blood in stool, diarrhea and vomiting.  Genitourinary:  Negative for difficulty urinating, flank pain, frequency and hematuria.  Musculoskeletal:  Negative for arthralgias and back pain.  Skin:  Negative for rash.  Neurological:  Negative for dizziness, speech difficulty, weakness, numbness and headaches.    Physical Exam Updated Vital Signs BP (!) 159/100 (BP Location: Right Arm)   Pulse 71   Temp 99 F (37.2 C) (Oral)   Resp 13   Ht 5\' 3"  (1.6 m)   Wt 84.8 kg   SpO2 100%   BMI 33.13 kg/m  Physical Exam Constitutional:      Appearance: She is well-developed.  HENT:     Head: Normocephalic and atraumatic.  Eyes:     Pupils: Pupils  are equal, round, and reactive to light.  Cardiovascular:     Rate and Rhythm: Normal rate and regular rhythm.     Heart sounds: Normal heart sounds.  Pulmonary:     Effort: Pulmonary effort is normal. No respiratory distress.     Breath sounds: Normal breath sounds. No wheezing or rales.  Chest:     Chest wall: No tenderness.  Abdominal:     General: Bowel sounds are normal.     Palpations: Abdomen is soft.     Tenderness: There is abdominal tenderness in the right lower quadrant. There is no guarding or rebound.  Musculoskeletal:        General: Normal range of motion.     Cervical back: Normal range of motion and neck supple.  Lymphadenopathy:      Cervical: No cervical adenopathy.  Skin:    General: Skin is warm and dry.     Findings: No rash.  Neurological:     Mental Status: She is alert and oriented to person, place, and time.     ED Results / Procedures / Treatments   Labs (all labs ordered are listed, but only abnormal results are displayed) Labs Reviewed  COMPREHENSIVE METABOLIC PANEL - Abnormal; Notable for the following components:      Result Value   Potassium 3.4 (*)    All other components within normal limits  URINALYSIS, ROUTINE W REFLEX MICROSCOPIC - Abnormal; Notable for the following components:   Hgb urine dipstick MODERATE (*)    Bacteria, UA RARE (*)    All other components within normal limits  CBC WITH DIFFERENTIAL/PLATELET  LIPASE, BLOOD  HCG, SERUM, QUALITATIVE  OVARIAN MALIGNANCY RISK-ROMA    EKG None  Radiology CT ABDOMEN PELVIS W CONTRAST Result Date: 01/21/2023 CLINICAL DATA:  Right lower quadrant abdominal pain. EXAM: CT ABDOMEN AND PELVIS WITH CONTRAST TECHNIQUE: Multidetector CT imaging of the abdomen and pelvis was performed using the standard protocol following bolus administration of intravenous contrast. RADIATION DOSE REDUCTION: This exam was performed according to the departmental dose-optimization program which includes automated exposure control, adjustment of the mA and/or kV according to patient size and/or use of iterative reconstruction technique. CONTRAST:  75mL OMNIPAQUE IOHEXOL 350 MG/ML SOLN COMPARISON:  CT abdomen pelvis dated 06/23/2015. FINDINGS: Evaluation of this exam is limited due to respiratory motion. Lower chest: The visualized lung bases are clear. No intra-abdominal free air.  Small upper abdominal ascites. Hepatobiliary: Subcentimeter hepatic hypodense focus is too small to characterize. The liver is otherwise unremarkable. No calcified gallstone. Pancreas: Unremarkable. No pancreatic ductal dilatation or surrounding inflammatory changes. Spleen: Normal in size without  focal abnormality. Adrenals/Urinary Tract: The adrenal glands unremarkable. There is no hydronephrosis on either side. The visualized ureters and urinary bladder appear unremarkable. Stomach/Bowel: Moderate stool throughout the colon. No bowel obstruction. The appendix is normal. Vascular/Lymphatic: The abdominal aorta and IVC appear unremarkable. No portal venous gas. There is no adenopathy. Reproductive: The uterus is anteverted. Other: There is a 9 x 10 x 9 cm solid mass in the lower abdomen above the urinary bladder which is of indeterminate origin, possibly an ovarian mass. This is not characterized on this CT. Further evaluation with MRI without and with contrast is recommended. Musculoskeletal: No acute osseous pathology. IMPRESSION: 1. Solid mass in the lower abdomen concerning for malignancy, likely ovarian in origin. Further characterization with MRI without and with contrast recommended. 2. Small upper abdominal ascites. 3. No bowel obstruction. Normal appendix. Electronically Signed   By: Burtis Junes  Radparvar M.D.   On: 01/21/2023 17:33    Procedures Procedures    Medications Ordered in ED Medications  iohexol (OMNIPAQUE) 350 MG/ML injection 75 mL (75 mLs Intravenous Contrast Given 01/21/23 1704)    ED Course/ Medical Decision Making/ A&P                                 Medical Decision Making Amount and/or Complexity of Data Reviewed Labs: ordered.   Patient is a 53 year old who presents with some ongoing pain in her right lower abdomen.  She first had an episode a month ago.  Labs reviewed.  Her urine is not consistent with infection.  Her white count is normal.  Her hemoglobin is nonconcerning.  Her electrolytes are nonconcerning.  CT scan shows a large mass possibly arising from the right ovary.  This is likely the etiology of her pain.  There is no other concerns such as kidney stones, appendicitis, small bowel obstruction or other concerning findings.  She does not have symptoms  consistent with ovarian torsion.  She has not required any pain medication in the ED.  I have discussed this patient with Dr. Imogene Burn with OB/GYN who will arrange close follow-up in the office.  Findings were discussed with the patient.  She was discharged home in good condition.  Return precautions were given.  Final Clinical Impression(s) / ED Diagnoses Final diagnoses:  Ovarian mass, right    Rx / DC Orders ED Discharge Orders     None         Rolan Bucco, MD 01/21/23 [phone]

## 2023-01-21 NOTE — ED Provider Triage Note (Signed)
Emergency Medicine Provider Triage Evaluation Note  Selena Collins , a 52 y.o. female  was evaluated in triage.  Pt complains of abd pain. Reoccuring pain to RLQ on going nearly a month, got worse. Mild nausea, no vomit.  No fever, chills, dysuria, vaginal bleeding or vaginal discharge.  Was seen at [facility] today and was told she has a mass in the abdomen and recommend ER eval  Review of Systems  Positive: As above Negative: As above  Physical Exam  BP 131/86   Pulse 80   Temp 99 F (37.2 C) (Oral)   Resp 16   Ht 5\' 3"  (1.6 m)   Wt 84.8 kg   SpO2 100%   BMI 33.13 kg/m  Gen:   Awake, no distress   Resp:  Normal effort  MSK:   Moves extremities without difficulty  Other:    Medical Decision Making  Medically screening exam initiated at 3:01 PM.  Appropriate orders placed.  Selena Collins was informed that the remainder of the evaluation will be completed by another provider, this initial triage assessment does not replace that evaluation, and the importance of remaining in the ED until their evaluation is complete.     Fayrene Helper, PA-C 01/21/23 [phone]

## 2023-01-22 ENCOUNTER — Telehealth: Payer: Self-pay | Admitting: *Deleted

## 2023-01-22 NOTE — Telephone Encounter (Signed)
Spoke with the patient regarding the referral to GYN oncology. Patient scheduled as new patient with Dr Pricilla Holm on 1/9 at 9 am.  Patient given an arrival time of 8:30 am.  Explained to the patient the the doctor will perform a pelvic exam at this visit. Patient given the policy that only one visitor allowed and that visitor must be over 16 yrs are allowed in the Cancer Center. Patient given the address/phone number for the clinic and that the center offers free valet service. Patient aware that masks are option.

## 2023-01-23 LAB — OVARIAN MALIGNANCY RISK-ROMA
Cancer Antigen (CA) 125: 6.2 U/mL (ref 0.0–38.1)
HE4: 31.6 pmol/L (ref 0.0–105.2)
Postmenopausal ROMA: 0.41
Premenopausal ROMA: 0.25

## 2023-01-23 LAB — PREMENOPAUSAL INTERP: LOW

## 2023-01-23 LAB — POSTMENOPAUSAL INTERP: LOW

## 2023-01-26 ENCOUNTER — Encounter: Payer: Self-pay | Admitting: Gynecologic Oncology

## 2023-01-29 ENCOUNTER — Inpatient Hospital Stay (HOSPITAL_BASED_OUTPATIENT_CLINIC_OR_DEPARTMENT_OTHER): Payer: BC Managed Care – PPO | Admitting: Psychiatry

## 2023-01-29 ENCOUNTER — Inpatient Hospital Stay: Payer: BC Managed Care – PPO | Attending: Psychiatry

## 2023-01-29 ENCOUNTER — Encounter: Payer: Self-pay | Admitting: Psychiatry

## 2023-01-29 VITALS — BP 140/70 | HR 72 | Temp 98.7°F | Resp 20 | Ht 63.0 in | Wt 189.2 lb

## 2023-01-29 DIAGNOSIS — R1900 Intra-abdominal and pelvic swelling, mass and lump, unspecified site: Secondary | ICD-10-CM

## 2023-01-29 DIAGNOSIS — R102 Pelvic and perineal pain: Secondary | ICD-10-CM

## 2023-01-29 DIAGNOSIS — Z79899 Other long term (current) drug therapy: Secondary | ICD-10-CM | POA: Diagnosis not present

## 2023-01-29 DIAGNOSIS — R1907 Generalized intra-abdominal and pelvic swelling, mass and lump: Secondary | ICD-10-CM | POA: Diagnosis present

## 2023-01-29 DIAGNOSIS — Z803 Family history of malignant neoplasm of breast: Secondary | ICD-10-CM | POA: Diagnosis not present

## 2023-01-29 DIAGNOSIS — Z8049 Family history of malignant neoplasm of other genital organs: Secondary | ICD-10-CM | POA: Diagnosis not present

## 2023-01-29 LAB — CEA (ACCESS): CEA (CHCC): 1.14 ng/mL (ref 0.00–5.00)

## 2023-01-29 LAB — LACTATE DEHYDROGENASE: LDH: 97 U/L — ABNORMAL LOW (ref 98–192)

## 2023-01-29 NOTE — Patient Instructions (Signed)
Plan to have an MRI of the pelvis and lab work today.   We will plan to see you in the office after the MRI to further discuss.

## 2023-01-29 NOTE — Progress Notes (Signed)
GYNECOLOGIC ONCOLOGY NEW PATIENT CONSULTATION  Date of Service: 01/29/2023 Referring Provider: Marcelle Overlie, MD   ASSESSMENT AND PLAN: Selena Collins is a 52 y.o. woman with a 10 cm solid pelvic mass, indeterminate origin, possibly ovarian mass per CT read.  Normal CA125.  Reviewed imaging and exam findings with patient. At this time, unclear if ovarian or uterine in origin.  On exam, mass is mobile which is somewhat reassuring.  In general, reviewed that this mass could be either a benign, borderline, or malignant process.  Recommend additional tumor markers today given solid nature of mass on imaging.  Will try to obtain ultrasound report from OB/GYN office as well.  Recommend MRI for better evaluation of mass to determine origin to help guide surgical counseling and aid in assessment of concern for malignancy.  At this time, CT did not show any evidence of lymphadenopathy or peritoneal disease.  Small volume ascites in upper abdomen.  Will plan for patient to return following MRI and tumor markers to discuss final surgical plan but reviewed this could possibly entail BSO +/- hysterectomy +/- staging procedures.  Offered to prescribe a few tablets of tramadol as needed for more severe pt. Pt declines at this time and will continue with ibuprofen prn.  A copy of this note was sent to the patient's referring provider.  Clide Cliff, MD Gynecologic Oncology   Medical Decision Making I personally spent  TOTAL 47 minutes face-to-face and non-face-to-face in the care of this patient, which includes all pre, intra, and post visit time on the date of service.    ------------  CC: Solid pelvic mass  HISTORY OF PRESENT ILLNESS:  Selena Collins is a 52 y.o. woman who is seen in consultation at the request of Marcelle Overlie, MD for evaluation of solid pelvic mass.  Patient presented to the emergency department on 01/21/2023 for right lower abdominal pain.  Patient reported a  similar episode about a month ago that resolved.  The pain recurred a few days prior to the emergency department visit.  She ultimately presented to the ED because the pain recurred and was persistent.  She underwent a CT abdomen/pelvis which noted a 9 x 10 x 9 solid mass in the lower abdomen of indeterminate origin.  No adenopathy, small upper abdominal ascites.  She then followed up with her OB/GYN on 01/22/2023.  An ultrasound was performed but was not available with the records at this consultation.  Tumor markers were collected with a normal CA125 6.2, and a low risk premenopausal ROMA.  Today patient presents with her husband.  She reports that the pain has again resolved since her emergency department visit and has not recurred.  It is made worse by standing for long periods of time or more activity.  She reports it felt like a menstrual cramp.She takes ibuprofen as needed for pain which she reports helps.  She notes a history of urinary frequency for several years with no recent change.  Her bowel movements are regular every day to every other day.  She had been on a GLP-1 medication and lost 40 pounds in the past year.  She had some early satiety with this.  She stopped medication in November with normalization of her appetite.  She otherwise denies new abdominal bloating, change in bowel or bladder habits.    PAST MEDICAL HISTORY: Past Medical History:  Diagnosis Date   Hypertension    Meningitis 1988    PAST SURGICAL HISTORY: Past Surgical History:  Procedure Laterality Date   LIPOSUCTION  2017   abdominal    OB/GYN HISTORY: OB History  Gravida Para Term Preterm AB Living  4 2 2  2 2   SAB IAB Ectopic Multiple Live Births   2   2    # Outcome Date GA Lbr Len/2nd Weight Sex Type Anes PTL Lv  4 IAB           3 IAB           2 Term      Vag-Spont   LIV  1 Term      Vag-Spont   LIV      Age at menarche: 39 Age at menopause: n/a, last period 07/2022 Hx of HRT: OCPs in past Hx  of STI: no Last pap: 12/24/21 NILM, HPV neg History of abnormal pap smears: no  SCREENING STUDIES:  Last mammogram: 07/2022 Last colonoscopy: 04/2021  MEDICATIONS:  Current Outpatient Medications:    amLODipine (NORVASC) 10 MG tablet, Take 1 tablet (10 mg total) by mouth daily., Disp: 90 tablet, Rfl: 0   fluticasone (FLONASE) 50 MCG/ACT nasal spray, Place 1 spray into both nostrils daily., Disp: 16 g, Rfl: 0   ibuprofen (ADVIL) 200 MG tablet, Take 400 mg by mouth every 6 (six) hours as needed., Disp: , Rfl:    ondansetron (ZOFRAN-ODT) 4 MG disintegrating tablet, 4mg  ODT q4 hours prn nausea/vomit, Disp: 10 tablet, Rfl: 0  ALLERGIES: Allergies  Allergen Reactions   Dust Mite Extract     FAMILY HISTORY: Family History  Problem Relation Age of Onset   Urticaria Mother    Other Mother        Brain tumor but may have been benign   Heart failure Father 29   Hypertension Father    Allergic rhinitis Sister    Hypertension Sister    Breast cancer Maternal Grandmother 76   Breast cancer Paternal Aunt        33's at age of dx   Cervical cancer Cousin    Ovarian cancer Neg Hx    Uterine cancer Neg Hx     SOCIAL HISTORY: Social History   Socioeconomic History   Marital status: Married    Spouse name: Not on file   Number of children: 2   Years of education: Not on file   Highest education level: Not on file  Occupational History   Not on file  Tobacco Use   Smoking status: Never   Smokeless tobacco: Never  Vaping Use   Vaping status: Never Used  Substance and Sexual Activity   Alcohol use: Yes   Drug use: Never   Sexual activity: Yes  Other Topics Concern   Not on file  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: Not on file  Food Insecurity: No Food Insecurity (01/26/2023)   Hunger Vital Sign    Worried About Running Out of Food in the Last Year: Never true    Ran Out of Food in the Last Year: Never true   Transportation Needs: No Transportation Needs (01/26/2023)   PRAPARE - Administrator, Civil Service (Medical): No    Lack of Transportation (Non-Medical): No  Physical Activity: Not on file  Stress: Not on file  Social Connections: Not on file  Intimate Partner Violence: Not At Risk (01/26/2023)   Humiliation, Afraid, Rape, and Kick questionnaire    Fear of Current or Ex-Partner: No  Emotionally Abused: No    Physically Abused: No    Sexually Abused: No    REVIEW OF SYSTEMS: New patient intake form was reviewed.  Complete 10-system review is negative except for the following: Hot flashes  PHYSICAL EXAM: BP (!) 143/87 (BP Location: Right Leg, Patient Position: Sitting) Comment: Notified RN  Pulse 72   Temp 98.7 F (37.1 C) (Oral)   Resp 20   Ht 5\' 3"  (1.6 m)   Wt 189 lb 3.2 oz (85.8 kg)   SpO2 100%   BMI 33.52 kg/m  Constitutional: No acute distress. Neuro/Psych: Alert, oriented.  Head and Neck: Normocephalic, atraumatic. Neck symmetric without masses. Sclera anicteric.  Respiratory: Normal work of breathing. Clear to auscultation bilaterally. Cardiovascular: Regular rate and rhythm, no murmurs, rubs, or gallops. Abdomen: Normoactive bowel sounds. Soft, non-distended, non-tender to palpation. Mobile solid mass palpated in suprapubic region. Extremities: Grossly normal range of motion. Warm, well perfused. No edema bilaterally. Skin: No rashes or lesions. Lymphatic: No cervical, supraclavicular, or inguinal adenopathy. Genitourinary: External genitalia without lesions. Urethral meatus without lesions or prolapse. On speculum exam, vagina and cervix without lesions. Bimanual exam reveals normal cervix. Solid mobile mass palpated anterior to the uterus, unclear if arising from the adnexa or uterus, slightly more oriented to the right. Exam chaperoned by Warner Mccreedy, NP   LABORATORY AND RADIOLOGIC DATA: Outside medical records were reviewed to synthesize the  above history, along with the history and physical obtained during the visit.  Outside laboratory, pathology, and imaging reports were reviewed, with pertinent results below.  I personally reviewed the outside images.  WBC  Date Value Ref Range Status  01/21/2023 6.9 4.0 - 10.5 K/uL Final   Hemoglobin  Date Value Ref Range Status  01/21/2023 12.6 12.0 - 15.0 g/dL Final  40/98/1191 47.8 11.1 - 15.9 g/dL Final   HCT  Date Value Ref Range Status  01/21/2023 38.3 36.0 - 46.0 % Final   Hematocrit  Date Value Ref Range Status  10/15/2017 42.0 34.0 - 46.6 % Final   Platelets  Date Value Ref Range Status  01/21/2023 355 150 - 400 K/uL Final  10/15/2017 363 150 - 450 x10E3/uL Final   Creatinine, Ser  Date Value Ref Range Status  01/21/2023 0.80 0.44 - 1.00 mg/dL Final   AST  Date Value Ref Range Status  01/21/2023 25 15 - 41 U/L Final   ALT  Date Value Ref Range Status  01/21/2023 23 0 - 44 U/L Final   Cancer Antigen (CA) 125  Date Value Ref Range Status  01/21/2023 6.2 0.0 - 38.1 U/mL Final    Comment:    (NOTE) Roche [employer]ostics Electrochemiluminescence Immunoassay (ECLIA) Values obtained with different assay methods or kits cannot be used interchangeably.  Results cannot be interpreted as absolute evidence of the presence or absence of malignant disease.    Diagnosis  Date Value Ref Range Status  07/07/2019   Final   - Negative for intraepithelial lesion or malignancy (NILM)    Lab Results  Component Value Date   CAN125 6.2 01/21/2023   CT ABDOMEN PELVIS W CONTRAST 01/21/2023  Narrative CLINICAL DATA:  Right lower quadrant abdominal pain.  EXAM: CT ABDOMEN AND PELVIS WITH CONTRAST  TECHNIQUE: Multidetector CT imaging of the abdomen and pelvis was performed using the standard protocol following bolus administration of intravenous contrast.  RADIATION DOSE REDUCTION: This exam was performed according to the departmental dose-optimization program which  includes automated exposure control, adjustment of the mA and/or kV  according to patient size and/or use of iterative reconstruction technique.  CONTRAST:  75mL OMNIPAQUE IOHEXOL 350 MG/ML SOLN  COMPARISON:  CT abdomen pelvis dated 06/23/2015.  FINDINGS: Evaluation of this exam is limited due to respiratory motion.  Lower chest: The visualized lung bases are clear.  No intra-abdominal free air.  Small upper abdominal ascites.  Hepatobiliary: Subcentimeter hepatic hypodense focus is too small to characterize. The liver is otherwise unremarkable. No calcified gallstone.  Pancreas: Unremarkable. No pancreatic ductal dilatation or surrounding inflammatory changes.  Spleen: Normal in size without focal abnormality.  Adrenals/Urinary Tract: The adrenal glands unremarkable. There is no hydronephrosis on either side. The visualized ureters and urinary bladder appear unremarkable.  Stomach/Bowel: Moderate stool throughout the colon. No bowel obstruction. The appendix is normal.  Vascular/Lymphatic: The abdominal aorta and IVC appear unremarkable. No portal venous gas. There is no adenopathy.  Reproductive: The uterus is anteverted.  Other: There is a 9 x 10 x 9 cm solid mass in the lower abdomen above the urinary bladder which is of indeterminate origin, possibly an ovarian mass. This is not characterized on this CT. Further evaluation with MRI without and with contrast is recommended.  Musculoskeletal: No acute osseous pathology.  IMPRESSION: 1. Solid mass in the lower abdomen concerning for malignancy, likely ovarian in origin. Further characterization with MRI without and with contrast recommended. 2. Small upper abdominal ascites. 3. No bowel obstruction. Normal appendix.   Electronically Signed By: Elgie Collard M.D. On: 01/21/2023 17:33

## 2023-01-30 LAB — AFP TUMOR MARKER: AFP, Serum, Tumor Marker: 2.1 ng/mL (ref 0.0–9.2)

## 2023-01-30 LAB — CANCER ANTIGEN 19-9: CA 19-9: 3 U/mL (ref 0–35)

## 2023-01-30 LAB — INHIBIN B: Inhibin B: 11.2 pg/mL

## 2023-02-05 ENCOUNTER — Ambulatory Visit (HOSPITAL_COMMUNITY)
Admission: RE | Admit: 2023-02-05 | Discharge: 2023-02-05 | Disposition: A | Discharge status: Home / Self Care | Payer: 59 | Source: Ambulatory Visit | Attending: Psychiatry | Admitting: Psychiatry

## 2023-02-05 DIAGNOSIS — R1900 Intra-abdominal and pelvic swelling, mass and lump, unspecified site: Secondary | ICD-10-CM

## 2023-02-05 MED ORDER — GADOBUTROL 1 MMOL/ML IV SOLN
7.0000 mL | Freq: Once | INTRAVENOUS | Status: AC | PRN
  Administered 2023-02-05: 7 mL via INTRAVENOUS

## 2023-02-08 ENCOUNTER — Ambulatory Visit: Payer: BC Managed Care – PPO | Admitting: Gynecologic Oncology

## 2023-02-12 ENCOUNTER — Encounter: Payer: Self-pay | Admitting: Psychiatry

## 2023-02-12 ENCOUNTER — Inpatient Hospital Stay (HOSPITAL_BASED_OUTPATIENT_CLINIC_OR_DEPARTMENT_OTHER): Payer: 59 | Admitting: Gynecologic Oncology

## 2023-02-12 ENCOUNTER — Other Ambulatory Visit: Payer: Self-pay | Admitting: Gynecologic Oncology

## 2023-02-12 ENCOUNTER — Inpatient Hospital Stay: Payer: 59 | Attending: Psychiatry | Admitting: Psychiatry

## 2023-02-12 VITALS — BP 120/76 | HR 70 | Temp 98.5°F | Resp 20 | Wt 185.9 lb

## 2023-02-12 DIAGNOSIS — R1900 Intra-abdominal and pelvic swelling, mass and lump, unspecified site: Secondary | ICD-10-CM

## 2023-02-12 MED ORDER — SENNOSIDES-DOCUSATE SODIUM 8.6-50 MG PO TABS: 2.0000 | ORAL_TABLET | Freq: Every day | ORAL | 0 refills | Status: AC

## 2023-02-12 MED ORDER — TRAMADOL HCL 50 MG PO TABS: 50.0000 mg | ORAL_TABLET | Freq: Four times a day (QID) | ORAL | 0 refills | Status: AC | PRN

## 2023-02-12 NOTE — Progress Notes (Signed)
 Patient here for follow up with Dr. Alvester Morin and for a pre-operative appointment prior to her scheduled surgery on 02/27/2023. She is scheduled for robotic assisted total laparoscopic hysterectomy (removal of the uterus and cervix), bilateral salpingo-oophorectomy, mini laparotomy, possible laparotomy. The surgery was discussed in detail.  See after visit summary for additional details.    Discussed post-op pain management in detail including the aspects of the enhanced recovery pathway.  Advised her that a new prescription would be sent in for tramadol and it is only to be used for after her upcoming surgery.  We discussed the use of tylenol post-op and to monitor for a maximum of 4,000 mg in a 24 hour period.  Also prescribed sennakot to be used after surgery and to hold if having loose stools.  Discussed bowel regimen in detail.     Discussed measures to take at home to prevent DVT including frequent mobility.  Reportable signs and symptoms of DVT discussed. Post-operative instructions discussed and expectations for after surgery. Incisional care discussed as well including reportable signs and symptoms including erythema, drainage, wound separation.     10 minutes spent preparing information and with the patient.  Verbalizing understanding of material discussed. No needs or concerns voiced at the end of the visit.   Advised patient to call for any needs.  Advised that her post-operative medications had been prescribed and could be picked up at any time.    This appointment is included in the global surgical bundle as pre-operative teaching and has no charge.

## 2023-02-12 NOTE — Patient Instructions (Signed)
 Preparing for your Surgery   Plan for surgery on 02/27/2023 with Dr. Hoy Masters at [facility]. You will be scheduled for robotic assisted total laparoscopic hysterectomy (removal of the uterus and cervix), bilateral salpingo-oophorectomy (removal of both ovaries and fallopian tubes),mini laparotomy (slightly larger incision on your abdomen to deliver surgical specimen through), possible laparotomy (larger incision on your abdomen if needed).    Pre-operative Testing -You will receive a phone call from presurgical testing at [facility] to arrange for a pre-operative appointment and lab work.   -Bring your insurance card, copy of an advanced directive if applicable, medication list   -At that visit, you will be asked to sign a consent for a possible blood transfusion in case a transfusion becomes necessary during surgery.  The need for a blood transfusion is rare but having consent is a necessary part of your care.      -You should not be taking blood thinners or aspirin at least ten days prior to surgery unless instructed by your surgeon.   -Do not take supplements such as fish oil (omega 3), red yeast rice, turmeric before your surgery. STOP TAKING AT LEAST 10 DAYS BEFORE SURGERY. You want to avoid medications with aspirin in them including headache powders such as BC or Goody's), Excedrin migraine.   Day Before Surgery at Home -You will be asked to take in a light diet the day before surgery. You will be advised you can have clear liquids up until 3 hours before your surgery.     Eat a light diet the day before surgery.  Examples including soups, broths, toast, yogurt, mashed potatoes.  AVOID GAS PRODUCING FOODS AND BEVERAGES. Things to avoid include carbonated beverages (fizzy beverages, sodas), raw fruits and raw vegetables (uncooked), or beans.    If your bowels are filled with gas, your surgeon will have difficulty visualizing your pelvic organs which increases your  surgical risks.   Your role in recovery Your role is to become active as soon as directed by your doctor, while still giving yourself time to heal.  Rest when you feel tired. You will be asked to do the following in order to speed your recovery:   - Cough and breathe deeply. This helps to clear and expand your lungs and can prevent pneumonia after surgery.  - STAY ACTIVE WHEN YOU GET HOME. Do mild physical activity. Walking or moving your legs help your circulation and body functions return to normal. Do not try to get up or walk alone the first time after surgery.   -If you develop swelling on one leg or the other, pain in the back of your leg, redness/warmth in one of your legs, please call the office or go to the Emergency Room to have a doppler to rule out a blood clot. For shortness of breath, chest pain-seek care in the Emergency Room as soon as possible. - Actively manage your pain. Managing your pain lets you move in comfort. We will ask you to rate your pain on a scale of zero to 10. It is your responsibility to tell your doctor or nurse where and how much you hurt so your pain can be treated.   Special Considerations -If you are diabetic, you may be placed on insulin after surgery to have closer control over your blood sugars to promote healing and recovery.  This does not mean that you will be discharged on insulin.  If applicable, your oral antidiabetics will be resumed when you  are tolerating a solid diet.   -Your final pathology results from surgery should be available around one week after surgery and the results will be relayed to you when available.   -Dr. Olam Mill is the surgeon that assists your GYN Oncologist with surgery.  If you end up staying the night, the next day after your surgery you will either see Dr. Viktoria, Dr. Eldonna, or Dr. Olam Mill.   -FMLA forms can be faxed to [phone] and please allow 5-7 business days for completion.   Pain Management  After Surgery -You will be prescribed your pain medication and bowel regimen medications before surgery so that you can have these available when you are discharged from the hospital. The pain medication is for use ONLY AFTER surgery and a new prescription will not be given.    -Make sure that you have Tylenol  and Ibuprofen IF YOU ARE ABLE TO TAKE THESE MEDICATIONS at home to use on a regular basis after surgery for pain control. We recommend alternating the medications every hour to six hours since they work differently and are processed in the body differently for pain relief.   -Review the attached handout on narcotic use and their risks and side effects.    Bowel Regimen -You will be prescribed Sennakot-S to take nightly to prevent constipation especially if you are taking the narcotic pain medication intermittently.  It is important to prevent constipation and drink adequate amounts of liquids. You can stop taking this medication when you are not taking pain medication and you are back on your normal bowel routine.   Risks of Surgery Risks of surgery are low but include bleeding, infection, damage to surrounding structures, re-operation, blood clots, and very rarely death.     Blood Transfusion Information (For the consent to be signed before surgery)   We will be checking your blood type before surgery so in case of emergencies, we will know what type of blood you would need.                                             WHAT IS A BLOOD TRANSFUSION?   A transfusion is the replacement of blood or some of its parts. Blood is made up of multiple cells which provide different functions. Red blood cells carry oxygen and are used for blood loss replacement. White blood cells fight against infection. Platelets control bleeding. Plasma helps clot blood. Other blood products are available for specialized needs, such as hemophilia or other clotting disorders. BEFORE THE TRANSFUSION  Who gives  blood for transfusions?  You may be able to donate blood to be used at a later date on yourself (autologous donation). Relatives can be asked to donate blood. This is generally not any safer than if you have received blood from a stranger. The same precautions are taken to ensure safety when a relative's blood is donated. Healthy volunteers who are fully evaluated to make sure their blood is safe. This is blood bank blood. Transfusion therapy is the safest it has ever been in the practice of medicine. Before blood is taken from a donor, a complete history is taken to make sure that person has no history of diseases nor engages in risky social behavior (examples are intravenous drug use or sexual activity with multiple partners). The donor's travel history is screened to minimize risk of transmitting infections,  such as malaria. The donated blood is tested for signs of infectious diseases, such as HIV and hepatitis. The blood is then tested to be sure it is compatible with you in order to minimize the chance of a transfusion reaction. If you or a relative donates blood, this is often done in anticipation of surgery and is not appropriate for emergency situations. It takes many days to process the donated blood. RISKS AND COMPLICATIONS Although transfusion therapy is very safe and saves many lives, the main dangers of transfusion include:  Getting an infectious disease. Developing a transfusion reaction. This is an allergic reaction to something in the blood you were given. Every precaution is taken to prevent this. The decision to have a blood transfusion has been considered carefully by your caregiver before blood is given. Blood is not given unless the benefits outweigh the risks.   AFTER SURGERY INSTRUCTIONS   Return to work: 4-6 weeks if applicable   Activity: 1. Be up and out of the bed during the day.  Take a nap if needed.  You may walk up steps but be careful and use the hand rail.  Stair  climbing will tire you more than you think, you may need to stop part way and rest.    2. No lifting or straining for 6 weeks over 10 pounds. No pushing, pulling, straining for 6 weeks.   3. No driving for 4-89 days when the following criteria have been met: Do not drive if you are taking narcotic pain medicine and make sure that your reaction time has returned.    4. You can shower as soon as the next day after surgery. Shower daily.  Use your regular soap and water  (not directly on the incision) and pat your incision(s) dry afterwards; don't rub.  No tub baths or submerging your body in water  until cleared by your surgeon. If you have the soap that was given to you by pre-surgical testing that was used before surgery, you do not need to use it afterwards because this can irritate your incisions.    5. No sexual activity and nothing in the vagina for 12 weeks.   6. You may experience a small amount of clear drainage from your incisions, which is normal.  If the drainage persists, increases, or changes color please call the office.   7. Do not use creams, lotions, or ointments such as neosporin on your incisions after surgery until advised by your surgeon because they can cause removal of the dermabond glue on your incisions.     8. You may experience vaginal spotting after surgery or when the stitches at the top of the vagina begin to dissolve.  The spotting is normal but if you experience heavy bleeding, call our office.   9. Take Tylenol  or ibuprofen first for pain if you are able to take these medications and only use narcotic pain medication for severe pain not relieved by the Tylenol  or Ibuprofen.  Monitor your Tylenol  intake to a max of 4,000 mg in a 24 hour period. You can alternate these medications after surgery.   Diet: 1. Low sodium Heart Healthy Diet is recommended but you are cleared to resume your normal (before surgery) diet after your procedure.   2. It is safe to use a  laxative, such as Miralax or Colace, if you have difficulty moving your bowels before surgery. You have been prescribed Sennakot-S to take at bedtime every evening after surgery to keep bowel movements regular  and to prevent constipation.     Wound Care: 1. Keep clean and dry.  Shower daily.   Reasons to call the Doctor: Fever - Oral temperature greater than 100.4 degrees Fahrenheit Foul-smelling vaginal discharge Difficulty urinating Nausea and vomiting Increased pain at the site of the incision that is unrelieved with pain medicine. Difficulty breathing with or without chest pain New calf pain especially if only on one side Sudden, continuing increased vaginal bleeding with or without clots.   Contacts: For questions or concerns you should contact:   Dr. Hoy Masters at [phone]   Eleanor Epps, NP at [phone]   After Hours: call [phone] and have the GYN Oncologist paged/contacted (after 5 pm or on the weekends). You will speak with an after hours RN and let he or she know you have had surgery.   Messages sent via mychart are for non-urgent matters and are not responded to after hours so for urgent needs, please call the after hours number.

## 2023-02-12 NOTE — Progress Notes (Signed)
 Gynecologic Oncology Return Clinic Visit  Date of Service: 02/12/2023 Referring Provider: Marcelle Overlie, MD   Assessment & Plan: Selena Collins is a 53 y.o. woman with a 10 cm solid pelvic mass, indeterminate origin.  Normal tumor markers.  Reviewed MRI results.  Still unclear at this time if this mass is arising from the uterus, adnexa, or intervening structures like the broad ligament.  Given this, we reviewed all possible surgical outcomes.  Reviewed pros and cons of BSO at time of surgery given perimenopausal (no period since July, hot flashes) and pros and cons of hysterectomy.  Does have uterine fibroids noted on MRI.  Reviewed that BSO and hysterectomy may be indicated regardless pending site of origin of solid mass or if a malignant process encountered.  Based on this discussion, patient would like to proceed with hysterectomy, bilateral salpingo-oophorectomy regardless of site of origin of mass.  Reviewed that because of the size and solid nature of the mass, this may not fit out vaginally.  Would plan to place this in a bag and removed through the abdominal wall.  Could consider scalpel morcellation within the bag.  Reviewed unknown risk of spread but likely low risk of spread with this method.  Benefit would be to reduce size of laparotomy incision.  Also, reviewed that pending location and nature of mass, we may find that minimally invasive approach is not feasible and then would proceed with laparotomy.  Patient was consented for: robotic assisted total laparoscopic hysterectomy, bilateral salpingo-oophorectomy, minilaparotomy, possible laparotomy on 02/27/23.  The risks of surgery were discussed in detail and she understands these to including but not limited to bleeding requiring a blood transfusion, infection, injury to adjacent organs (including but not limited to the bowels, bladder, ureters, nerves, blood vessels), thromboembolic events, wound separation, hernia, vaginal cuff  separation, possible risk of lymphedema and lymphocyst if lymphadenectomy performed, unforseen complication, and possible need for re-exploration.  If the patient experiences any of these events, she understands that her hospitalization or recovery may be prolonged and that she may need to take additional medications for a prolonged period. The patient will receive DVT and antibiotic prophylaxis as indicated. She voiced a clear understanding. She had the opportunity to ask questions and informed consent was obtained today. She wishes to proceed.  Patient works in [employer], desk work.  Discussed taking off 4 weeks at a minimum up to 6 weeks.  Patient could go back to work sooner if desired pending on how she is healing a postoperative visit.  Additionally, reviewed that she may have an increase in hot flashes with surgical BSO.  Could consider HRT pending final pathology.  Will hold off on HRT at this time.  She does not require preoperative clearance. Her METs are >4.  All preoperative instructions were reviewed. Postoperative expectations were also reviewed. Written handouts were provided to the patient.  RTC postop.  Clide Cliff, MD Gynecologic Oncology   Medical Decision Making I personally spent  TOTAL 50 minutes face-to-face and non-face-to-face in the care of this patient, which includes all pre, intra, and post visit time on the date of service.   ----------------------- Reason for Visit: Follow-up/Treatment discussion  Treatment History: Patient presented to the emergency department on 01/21/2023 for right lower abdominal pain.  Patient reported a similar episode about a month ago that resolved.  The pain recurred a few days prior to the emergency department visit.  She ultimately presented to the ED because the pain recurred and was persistent.  She underwent a CT abdomen/pelvis which noted a 9 x 10 x 9 solid mass in the lower abdomen of indeterminate origin.  No  adenopathy, small upper abdominal ascites.  She then followed up with her OB/GYN on 01/22/2023.  An ultrasound was performed but was not available with the records at this consultation.  Tumor markers were collected with a normal CA125 6.2, and a low risk premenopausal ROMA.   Interval History: Patient presents today with her sister.  Overall no changes since her last visit.  Reports ongoing urinary frequency.  Pain is overall more controlled at this time.  Only needs occasional ibuprofen.  Past Medical/Surgical History: Past Medical History:  Diagnosis Date   Hypertension    Meningitis 1988    Past Surgical History:  Procedure Laterality Date   LIPOSUCTION  2017   abdominal    Family History  Problem Relation Age of Onset   Urticaria Mother    Other Mother        Brain tumor but may have been benign   Heart failure Father 74   Hypertension Father    Allergic rhinitis Sister    Hypertension Sister    Breast cancer Maternal Grandmother 61   Breast cancer Paternal Aunt        79's at age of dx   Cervical cancer Cousin    Ovarian cancer Neg Hx    Uterine cancer Neg Hx     Social History   Socioeconomic History   Marital status: Married    Spouse name: Not on file   Number of children: 2   Years of education: Not on file   Highest education level: Not on file  Occupational History   Not on file  Tobacco Use   Smoking status: Never   Smokeless tobacco: Never  Vaping Use   Vaping status: Never Used  Substance and Sexual Activity   Alcohol use: Yes   Drug use: Never   Sexual activity: Yes  Other Topics Concern   Not on file  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: Not on file  Food Insecurity: No Food Insecurity (01/26/2023)   Hunger Vital Sign    Worried About Running Out of Food in the Last Year: Never true    Ran Out of Food in the Last Year: Never true  Transportation Needs: No Transportation Needs  (01/26/2023)   PRAPARE - Administrator, Civil Service (Medical): No    Lack of Transportation (Non-Medical): No  Physical Activity: Not on file  Stress: Not on file  Social Connections: Not on file    Current Medications:  Current Outpatient Medications:    amLODipine (NORVASC) 10 MG tablet, Take 1 tablet (10 mg total) by mouth daily., Disp: 90 tablet, Rfl: 0   fluticasone (FLONASE) 50 MCG/ACT nasal spray, Place 1 spray into both nostrils daily., Disp: 16 g, Rfl: 0   ibuprofen (ADVIL) 200 MG tablet, Take 400 mg by mouth every 6 (six) hours as needed., Disp: , Rfl:    ondansetron (ZOFRAN-ODT) 4 MG disintegrating tablet, 4mg  ODT q4 hours prn nausea/vomit, Disp: 10 tablet, Rfl: 0  Review of Symptoms: Complete 10-system review is negative except as above in Interval History.  Physical Exam: BP 120/76 (BP Location: Left Arm, Patient Position: Sitting)   Pulse 70   Temp 98.5 F (36.9 C) (Oral)   Resp 20   Wt 185 lb 14.4 oz (84.3 kg)  SpO2 100%   BMI 32.93 kg/m  General: Alert, oriented, no acute distress. HEENT: Normocephalic, atraumatic. Neck symmetric without masses. Sclera anicteric.  Chest: Normal work of breathing.  Cardiovascular: Regular rate and rhythm Extremities: Grossly normal range of motion.  Warm, well perfused.  No edema bilaterally. Skin: No rashes or lesions noted.  Laboratory & Radiologic Studies: Lab Results  Component Value Date   CAN125 6.2 01/21/2023   CEA 1.14 01/29/2023   INHBB 11.2 01/29/2023   AFPTUMOR 2.1 01/29/2023   LDH 97 (L) 01/29/2023   MR Pelvis W Wo Contrast 02/05/2023  Narrative CLINICAL DATA:  Pelvic pain, acute, post-menopausal eval for uterine sarcoma versus solid ovarian mass  New pelvic mass on abdominopelvic CT.  EXAM: MRI PELVIS WITHOUT AND WITH CONTRAST  TECHNIQUE: Multiplanar multisequence MR imaging of the pelvis was performed both before and after administration of intravenous contrast.  CONTRAST:  7mL  GADAVIST GADOBUTROL 1 MMOL/ML IV SOLN  COMPARISON:  Abdominopelvic CT 01/21/2023 and 06/23/2015.  FINDINGS: Urinary Tract: The visualized distal ureters are not dilated. There is compression of the bladder by the pelvic mass described below.  Bowel: No bowel wall thickening, distention or surrounding inflammation identified within the pelvis. Distal colonic diverticulosis.  Vascular/Lymphatic: No enlarged pelvic lymph nodes identified. No significant vascular findings.  Reproductive:  Uterus: Measures 8.2 x 4.8 x 4.7 cm. At least one small intramural fibroid is noted anteriorly, measuring 1.4 cm on image 23/5. As seen on CT, there is a large midline pelvic mass superior to the bladder and anterior to the uterine fundus. This mass demonstrates no clear connection with the uterus. It measures approximately 11.1 x 8.5 x 8.7 cm and demonstrates heterogeneous predominantly low T2 signal. The T1 signal is fairly homogeneous, and no definite enhancement of this lesion is seen following contrast.  Endometrium: No endometrial thickening or focal abnormality identified.  Cervix/Vagina:  Appears normal.  Right ovary: The right ovary appears to be separate from the midline mass and demonstrates no focal abnormality.  Left ovary: The left ovary appears to be separate from the midline mass and demonstrates no focal abnormality  Other: Small to moderate amount of free pelvic fluid without peritoneal nodularity or suspicious enhancement.  Musculoskeletal: No acute or worrisome osseous findings. Lower lumbar spondylosis with a broad-based disc protrusion at L5-S1 (S1 transitional). Left hip degenerative changes. Bilateral common hamstring tendinosis.  IMPRESSION: 1. Large midline pelvic mass demonstrates no clear connection with the uterus or ovaries. The mass demonstrates heterogeneous, predominately low T2 signal no definite enhancement following contrast. Although nonspecific,  features favor an extrauterine fibroid or an ovarian fibroma or fibrothecoma. Sarcoma not excluded by imaging, although the lack of enhancement argues against that. If not surgically removed, recommend short-term imaging follow-up. 2. Small to moderate amount of free pelvic fluid without peritoneal nodularity or suspicious enhancement. 3. No enlarged pelvic lymph nodes identified.  No hydronephrosis. 4. Lower lumbar spondylosis with a broad-based disc protrusion at L5-S1.   Electronically Signed By: Carey Bullocks M.D. On: 02/12/2023 08:59

## 2023-02-12 NOTE — H&P (View-Only) (Signed)
Patient here for follow up with Dr. Alvester Morin and for a pre-operative appointment prior to her scheduled surgery on 02/27/2023. She is scheduled for robotic assisted total laparoscopic hysterectomy (removal of the uterus and cervix), bilateral salpingo-oophorectomy, mini laparotomy, possible laparotomy. The surgery was discussed in detail.  See after visit summary for additional details.    Discussed post-op pain management in detail including the aspects of the enhanced recovery pathway.  Advised her that a new prescription would be sent in for tramadol and it is only to be used for after her upcoming surgery.  We discussed the use of tylenol post-op and to monitor for a maximum of 4,000 mg in a 24 hour period.  Also prescribed sennakot to be used after surgery and to hold if having loose stools.  Discussed bowel regimen in detail.     Discussed measures to take at home to prevent DVT including frequent mobility.  Reportable signs and symptoms of DVT discussed. Post-operative instructions discussed and expectations for after surgery. Incisional care discussed as well including reportable signs and symptoms including erythema, drainage, wound separation.     10 minutes spent preparing information and with the patient.  Verbalizing understanding of material discussed. No needs or concerns voiced at the end of the visit.   Advised patient to call for any needs.  Advised that her post-operative medications had been prescribed and could be picked up at any time.    This appointment is included in the global surgical bundle as pre-operative teaching and has no charge.

## 2023-02-12 NOTE — H&P (View-Only) (Signed)
Gynecologic Oncology Return Clinic Visit  Date of Service: 02/12/2023 Referring Provider: Marcelle Overlie, MD   Assessment & Plan: Selena Collins is a 53 y.o. woman with a 10 cm solid pelvic mass, indeterminate origin.  Normal tumor markers.  Reviewed MRI results.  Still unclear at this time if this mass is arising from the uterus, adnexa, or intervening structures like the broad ligament.  Given this, we reviewed all possible surgical outcomes.  Reviewed pros and cons of BSO at time of surgery given perimenopausal (no period since July, hot flashes) and pros and cons of hysterectomy.  Does have uterine fibroids noted on MRI.  Reviewed that BSO and hysterectomy may be indicated regardless pending site of origin of solid mass or if a malignant process encountered.  Based on this discussion, patient would like to proceed with hysterectomy, bilateral salpingo-oophorectomy regardless of site of origin of mass.  Reviewed that because of the size and solid nature of the mass, this may not fit out vaginally.  Would plan to place this in a bag and removed through the abdominal wall.  Could consider scalpel morcellation within the bag.  Reviewed unknown risk of spread but likely low risk of spread with this method.  Benefit would be to reduce size of laparotomy incision.  Also, reviewed that pending location and nature of mass, we may find that minimally invasive approach is not feasible and then would proceed with laparotomy.  Patient was consented for: robotic assisted total laparoscopic hysterectomy, bilateral salpingo-oophorectomy, minilaparotomy, possible laparotomy on 02/27/23.  The risks of surgery were discussed in detail and she understands these to including but not limited to bleeding requiring a blood transfusion, infection, injury to adjacent organs (including but not limited to the bowels, bladder, ureters, nerves, blood vessels), thromboembolic events, wound separation, hernia, vaginal cuff  separation, possible risk of lymphedema and lymphocyst if lymphadenectomy performed, unforseen complication, and possible need for re-exploration.  If the patient experiences any of these events, she understands that her hospitalization or recovery may be prolonged and that she may need to take additional medications for a prolonged period. The patient will receive DVT and antibiotic prophylaxis as indicated. She voiced a clear understanding. She had the opportunity to ask questions and informed consent was obtained today. She wishes to proceed.  Patient works in [employer], desk work.  Discussed taking off 4 weeks at a minimum up to 6 weeks.  Patient could go back to work sooner if desired pending on how she is healing a postoperative visit.  Additionally, reviewed that she may have an increase in hot flashes with surgical BSO.  Could consider HRT pending final pathology.  Will hold off on HRT at this time.  She does not require preoperative clearance. Her METs are >4.  All preoperative instructions were reviewed. Postoperative expectations were also reviewed. Written handouts were provided to the patient.  RTC postop.  Clide Cliff, MD Gynecologic Oncology   Medical Decision Making I personally spent  TOTAL 50 minutes face-to-face and non-face-to-face in the care of this patient, which includes all pre, intra, and post visit time on the date of service.   ----------------------- Reason for Visit: Follow-up/Treatment discussion  Treatment History: Patient presented to the emergency department on 01/21/2023 for right lower abdominal pain.  Patient reported a similar episode about a month ago that resolved.  The pain recurred a few days prior to the emergency department visit.  She ultimately presented to the ED because the pain recurred and was persistent.  She underwent a CT abdomen/pelvis which noted a 9 x 10 x 9 solid mass in the lower abdomen of indeterminate origin.  No  adenopathy, small upper abdominal ascites.  She then followed up with her OB/GYN on 01/22/2023.  An ultrasound was performed but was not available with the records at this consultation.  Tumor markers were collected with a normal CA125 6.2, and a low risk premenopausal ROMA.   Interval History: Patient presents today with her sister.  Overall no changes since her last visit.  Reports ongoing urinary frequency.  Pain is overall more controlled at this time.  Only needs occasional ibuprofen.  Past Medical/Surgical History: Past Medical History:  Diagnosis Date   Hypertension    Meningitis 1988    Past Surgical History:  Procedure Laterality Date   LIPOSUCTION  2017   abdominal    Family History  Problem Relation Age of Onset   Urticaria Mother    Other Mother        Brain tumor but may have been benign   Heart failure Father 74   Hypertension Father    Allergic rhinitis Sister    Hypertension Sister    Breast cancer Maternal Grandmother 61   Breast cancer Paternal Aunt        79's at age of dx   Cervical cancer Cousin    Ovarian cancer Neg Hx    Uterine cancer Neg Hx     Social History   Socioeconomic History   Marital status: Married    Spouse name: Not on file   Number of children: 2   Years of education: Not on file   Highest education level: Not on file  Occupational History   Not on file  Tobacco Use   Smoking status: Never   Smokeless tobacco: Never  Vaping Use   Vaping status: Never Used  Substance and Sexual Activity   Alcohol use: Yes   Drug use: Never   Sexual activity: Yes  Other Topics Concern   Not on file  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: Not on file  Food Insecurity: No Food Insecurity (01/26/2023)   Hunger Vital Sign    Worried About Running Out of Food in the Last Year: Never true    Ran Out of Food in the Last Year: Never true  Transportation Needs: No Transportation Needs  (01/26/2023)   PRAPARE - Administrator, Civil Service (Medical): No    Lack of Transportation (Non-Medical): No  Physical Activity: Not on file  Stress: Not on file  Social Connections: Not on file    Current Medications:  Current Outpatient Medications:    amLODipine (NORVASC) 10 MG tablet, Take 1 tablet (10 mg total) by mouth daily., Disp: 90 tablet, Rfl: 0   fluticasone (FLONASE) 50 MCG/ACT nasal spray, Place 1 spray into both nostrils daily., Disp: 16 g, Rfl: 0   ibuprofen (ADVIL) 200 MG tablet, Take 400 mg by mouth every 6 (six) hours as needed., Disp: , Rfl:    ondansetron (ZOFRAN-ODT) 4 MG disintegrating tablet, 4mg  ODT q4 hours prn nausea/vomit, Disp: 10 tablet, Rfl: 0  Review of Symptoms: Complete 10-system review is negative except as above in Interval History.  Physical Exam: BP 120/76 (BP Location: Left Arm, Patient Position: Sitting)   Pulse 70   Temp 98.5 F (36.9 C) (Oral)   Resp 20   Wt 185 lb 14.4 oz (84.3 kg)  SpO2 100%   BMI 32.93 kg/m  General: Alert, oriented, no acute distress. HEENT: Normocephalic, atraumatic. Neck symmetric without masses. Sclera anicteric.  Chest: Normal work of breathing.  Cardiovascular: Regular rate and rhythm Extremities: Grossly normal range of motion.  Warm, well perfused.  No edema bilaterally. Skin: No rashes or lesions noted.  Laboratory & Radiologic Studies: Lab Results  Component Value Date   CAN125 6.2 01/21/2023   CEA 1.14 01/29/2023   INHBB 11.2 01/29/2023   AFPTUMOR 2.1 01/29/2023   LDH 97 (L) 01/29/2023   MR Pelvis W Wo Contrast 02/05/2023  Narrative CLINICAL DATA:  Pelvic pain, acute, post-menopausal eval for uterine sarcoma versus solid ovarian mass  New pelvic mass on abdominopelvic CT.  EXAM: MRI PELVIS WITHOUT AND WITH CONTRAST  TECHNIQUE: Multiplanar multisequence MR imaging of the pelvis was performed both before and after administration of intravenous contrast.  CONTRAST:  7mL  GADAVIST GADOBUTROL 1 MMOL/ML IV SOLN  COMPARISON:  Abdominopelvic CT 01/21/2023 and 06/23/2015.  FINDINGS: Urinary Tract: The visualized distal ureters are not dilated. There is compression of the bladder by the pelvic mass described below.  Bowel: No bowel wall thickening, distention or surrounding inflammation identified within the pelvis. Distal colonic diverticulosis.  Vascular/Lymphatic: No enlarged pelvic lymph nodes identified. No significant vascular findings.  Reproductive:  Uterus: Measures 8.2 x 4.8 x 4.7 cm. At least one small intramural fibroid is noted anteriorly, measuring 1.4 cm on image 23/5. As seen on CT, there is a large midline pelvic mass superior to the bladder and anterior to the uterine fundus. This mass demonstrates no clear connection with the uterus. It measures approximately 11.1 x 8.5 x 8.7 cm and demonstrates heterogeneous predominantly low T2 signal. The T1 signal is fairly homogeneous, and no definite enhancement of this lesion is seen following contrast.  Endometrium: No endometrial thickening or focal abnormality identified.  Cervix/Vagina:  Appears normal.  Right ovary: The right ovary appears to be separate from the midline mass and demonstrates no focal abnormality.  Left ovary: The left ovary appears to be separate from the midline mass and demonstrates no focal abnormality  Other: Small to moderate amount of free pelvic fluid without peritoneal nodularity or suspicious enhancement.  Musculoskeletal: No acute or worrisome osseous findings. Lower lumbar spondylosis with a broad-based disc protrusion at L5-S1 (S1 transitional). Left hip degenerative changes. Bilateral common hamstring tendinosis.  IMPRESSION: 1. Large midline pelvic mass demonstrates no clear connection with the uterus or ovaries. The mass demonstrates heterogeneous, predominately low T2 signal no definite enhancement following contrast. Although nonspecific,  features favor an extrauterine fibroid or an ovarian fibroma or fibrothecoma. Sarcoma not excluded by imaging, although the lack of enhancement argues against that. If not surgically removed, recommend short-term imaging follow-up. 2. Small to moderate amount of free pelvic fluid without peritoneal nodularity or suspicious enhancement. 3. No enlarged pelvic lymph nodes identified.  No hydronephrosis. 4. Lower lumbar spondylosis with a broad-based disc protrusion at L5-S1.   Electronically Signed By: Carey Bullocks M.D. On: 02/12/2023 08:59

## 2023-02-13 ENCOUNTER — Telehealth: Payer: Self-pay

## 2023-02-13 NOTE — Telephone Encounter (Signed)
 Ms.Lilley called office requesting a letter be emailed to her email at [email or web address]. regarding surgery date and return to work date.   Letter emailed to pt with dates of 02/27/23 - 04/10/23

## 2023-02-14 NOTE — Patient Instructions (Addendum)
SURGICAL WAITING ROOM VISITATION  Patients having surgery or a procedure may have no more than 2 support people in the waiting area - these visitors may rotate.    Children under the age of 34 must have an adult with them who is not the patient.  Due to an increase in RSV and influenza rates and associated hospitalizations, children ages 22 and under may not visit patients in [facility] hospitals.  Visitors with respiratory illnesses are discouraged from visiting and should remain at home.  If the patient needs to stay at the hospital during part of their recovery, the visitor guidelines for inpatient rooms apply. Pre-op nurse will coordinate an appropriate time for 1 support person to accompany patient in pre-op.  This support person may not rotate.    Please refer to the [facility] website for the visitor guidelines for Inpatients (after your surgery is over and you are in a regular room).    Your procedure is scheduled on: 02/27/23   Report to [facility] Main Entrance    Report to admitting at 6:15 AM   Call this number if you have problems the morning of surgery [phone]   Do not eat food :After Midnight.   After Midnight you may have the following liquids until 5:30 AM DAY OF SURGERY  Water Non-Citrus Juices (without pulp, NO RED-Apple, White grape, White cranberry) Black Coffee (NO MILK/CREAM OR CREAMERS, sugar ok)  Clear Tea (NO MILK/CREAM OR CREAMERS, sugar ok) regular and decaf                             Plain Jell-O (NO RED)                                           Fruit ices (not with fruit pulp, NO RED)                                     Popsicles (NO RED)                                                               Sports drinks like Gatorade (NO RED)                      If you have questions, please contact your surgeon's office.   FOLLOW BOWEL PREP AND ANY ADDITIONAL PRE OP INSTRUCTIONS YOU RECEIVED FROM YOUR SURGEON'S OFFICE!!!     Oral  Hygiene is also important to reduce your risk of infection.                                    Remember - BRUSH YOUR TEETH THE MORNING OF SURGERY WITH YOUR REGULAR TOOTHPASTE  DENTURES WILL BE REMOVED PRIOR TO SURGERY PLEASE DO NOT APPLY "Poly grip" OR ADHESIVES!!!   Stop all vitamins and herbal supplements 7 days before surgery.   Take these medicines the morning of surgery with A SIP OF WATER: Amlodipine,  Atorvastatin, Flonase if needed             You may not have any metal on your body including hair pins, jewelry, and body piercing             Do not wear make-up, lotions, powders, perfumes, or deodorant  Do not wear nail polish including gel and S&S, artificial/acrylic nails, or any other type of covering on natural nails including finger and toenails. If you have artificial nails, gel coating, etc. that needs to be removed by a nail salon please have this removed prior to surgery or surgery may need to be canceled/ delayed if the surgeon/ anesthesia feels like they are unable to be safely monitored.   Do not shave  48 hours prior to surgery.    Do not bring valuables to the hospital. [facility] IS NOT             RESPONSIBLE   FOR VALUABLES.   Contacts, glasses, dentures or bridgework may not be worn into surgery.  DO NOT BRING YOUR HOME MEDICATIONS TO THE HOSPITAL. PHARMACY WILL DISPENSE MEDICATIONS LISTED ON YOUR MEDICATION LIST TO YOU DURING YOUR ADMISSION IN THE HOSPITAL!    Patients discharged on the day of surgery will not be allowed to drive home.  Someone NEEDS to stay with you for the first 24 hours after anesthesia.   Special Instructions: Bring a copy of your healthcare power of attorney and living will documents the day of surgery if you haven't scanned them before.              Please read over the following fact sheets you were given: IF YOU HAVE QUESTIONS ABOUT YOUR PRE-OP INSTRUCTIONS PLEASE CALL [name]    If you received a COVID test during your  pre-op visit  it is requested that you wear a mask when out in public, stay away from anyone that may not be feeling well and notify your surgeon if you develop symptoms. If you test positive for Covid or have been in contact with anyone that has tested positive in the last 10 days please notify you surgeon.    [facility] - Preparing for Surgery Before surgery, you can play an important role.  Because skin is not sterile, your skin needs to be as free of germs as possible.  You can reduce the number of germs on your skin by washing with CHG (chlorahexidine gluconate) soap before surgery.  CHG is an antiseptic cleaner which kills germs and bonds with the skin to continue killing germs even after washing. Please DO NOT use if you have an allergy to CHG or antibacterial soaps.  If your skin becomes reddened/irritated stop using the CHG and inform your nurse when you arrive at Short Stay. Do not shave (including legs and underarms) for at least 48 hours prior to the first CHG shower.  You may shave your face/neck.  Please follow these instructions carefully:  1.  Shower with CHG Soap the night before surgery and the  morning of surgery.  2.  If you choose to wash your hair, wash your hair first as usual with your normal  shampoo.  3.  After you shampoo, rinse your hair and body thoroughly to remove the shampoo.                             4.  Use CHG as you would any other liquid  soap.  You can apply chg directly to the skin and wash.  Gently with a scrungie or clean washcloth.  5.  Apply the CHG Soap to your body ONLY FROM THE NECK DOWN.   Do   not use on face/ open                           Wound or open sores. Avoid contact with eyes, ears mouth and   genitals (private parts).                       Wash face,  Genitals (private parts) with your normal soap.             6.  Wash thoroughly, paying special attention to the area where your    surgery  will be performed.  7.  Thoroughly rinse your body  with warm water from the neck down.  8.  DO NOT shower/wash with your normal soap after using and rinsing off the CHG Soap.                9.  Pat yourself dry with a clean towel.            10.  Wear clean pajamas.            11.  Place clean sheets on your bed the night of your first shower and do not  sleep with pets. Day of Surgery : Do not apply any lotions/deodorants the morning of surgery.  Please wear clean clothes to the hospital/surgery center.  FAILURE TO FOLLOW THESE INSTRUCTIONS MAY RESULT IN THE CANCELLATION OF YOUR SURGERY  PATIENT SIGNATURE_________________________________  NURSE SIGNATURE__________________________________  ________________________________________________________________________ WHAT IS A BLOOD TRANSFUSION? Blood Transfusion Information  A transfusion is the replacement of blood or some of its parts. Blood is made up of multiple cells which provide different functions. Red blood cells carry oxygen and are used for blood loss replacement. White blood cells fight against infection. Platelets control bleeding. Plasma helps clot blood. Other blood products are available for specialized needs, such as hemophilia or other clotting disorders. BEFORE THE TRANSFUSION  Who gives blood for transfusions?  Healthy volunteers who are fully evaluated to make sure their blood is safe. This is blood bank blood. Transfusion therapy is the safest it has ever been in the practice of medicine. Before blood is taken from a donor, a complete history is taken to make sure that person has no history of diseases nor engages in risky social behavior (examples are intravenous drug use or sexual activity with multiple partners). The donor's travel history is screened to minimize risk of transmitting infections, such as malaria. The donated blood is tested for signs of infectious diseases, such as HIV and hepatitis. The blood is then tested to be sure it is compatible with you in order  to minimize the chance of a transfusion reaction. If you or a relative donates blood, this is often done in anticipation of surgery and is not appropriate for emergency situations. It takes many days to process the donated blood. RISKS AND COMPLICATIONS Although transfusion therapy is very safe and saves many lives, the main dangers of transfusion include:  Getting an infectious disease. Developing a transfusion reaction. This is an allergic reaction to something in the blood you were given. Every precaution is taken to prevent this. The decision to have a blood transfusion has been considered carefully by your caregiver  before blood is given. Blood is not given unless the benefits outweigh the risks. AFTER THE TRANSFUSION Right after receiving a blood transfusion, you will usually feel much better and more energetic. This is especially true if your red blood cells have gotten low (anemic). The transfusion raises the level of the red blood cells which carry oxygen, and this usually causes an energy increase. The nurse administering the transfusion will monitor you carefully for complications. HOME CARE INSTRUCTIONS  No special instructions are needed after a transfusion. You may find your energy is better. Speak with your caregiver about any limitations on activity for underlying diseases you may have. SEEK MEDICAL CARE IF:  Your condition is not improving after your transfusion. You develop redness or irritation at the intravenous (IV) site. SEEK IMMEDIATE MEDICAL CARE IF:  Any of the following symptoms occur over the next 12 hours: Shaking chills. You have a temperature by mouth above 102 F (38.9 C), not controlled by medicine. Chest, back, or muscle pain. People around you feel you are not acting correctly or are confused. Shortness of breath or difficulty breathing. Dizziness and fainting. You get a rash or develop hives. You have a decrease in urine output. Your urine turns a dark  color or changes to pink, red, or brown. Any of the following symptoms occur over the next 10 days: You have a temperature by mouth above 102 F (38.9 C), not controlled by medicine. Shortness of breath. Weakness after normal activity. The white part of the eye turns yellow (jaundice). You have a decrease in the amount of urine or are urinating less often. Your urine turns a dark color or changes to pink, red, or brown. Document Released: 01/14/2000 Document Revised: 04/10/2011 Document Reviewed: 09/02/2007 [facility] Patient Information 2014 [place], [place].  _______________________________________________________________________

## 2023-02-14 NOTE — Progress Notes (Addendum)
COVID Vaccine Completed:yes  Date of COVID positive in last 90 days: no  PCP - Milus Height, PA Cardiologist - Lennie Odor, MD LOV 03/14/19, no f/u needed  Chest x-ray - n/a EKG - 01/22/23 Epic Stress Test - n/a ECHO - n/a Cardiac Cath - n/a Pacemaker/ICD device last checked: n/a Spinal Cord Stimulator: n/a  Bowel Prep - no  Sleep Study - n/a CPAP -   Fasting Blood Sugar - preDM, no meds or BS check at home Checks Blood Sugar _____ times a day  Last dose of GLP1 agonist-  N/A GLP1 instructions:  Hold 7 days before surgery    Last dose of SGLT-2 inhibitors-  N/A SGLT-2 instructions:  Hold 3 days before surgery    Blood Thinner Instructions: n/a Aspirin Instructions: Last Dose:  Activity level: Can go up a flight of stairs and perform activities of daily living without stopping and without symptoms of chest pain or shortness of breath.   Anesthesia review:   Patient denies shortness of breath, fever, cough and chest pain at PAT appointment  Patient verbalized understanding of instructions that were given to them at the PAT appointment. Patient was also instructed that they will need to review over the PAT instructions again at home before surgery.

## 2023-02-15 ENCOUNTER — Encounter: Payer: Self-pay | Admitting: Obstetrics and Gynecology

## 2023-02-16 ENCOUNTER — Other Ambulatory Visit: Payer: Self-pay

## 2023-02-16 ENCOUNTER — Encounter (HOSPITAL_COMMUNITY): Payer: Self-pay

## 2023-02-16 ENCOUNTER — Encounter (HOSPITAL_COMMUNITY)
Admission: RE | Admit: 2023-02-16 | Discharge: 2023-02-16 | Disposition: A | Discharge status: Home / Self Care | Payer: 59 | Source: Ambulatory Visit | Attending: Psychiatry | Admitting: Psychiatry

## 2023-02-16 DIAGNOSIS — Z01812 Encounter for preprocedural laboratory examination: Secondary | ICD-10-CM | POA: Insufficient documentation

## 2023-02-16 DIAGNOSIS — R1900 Intra-abdominal and pelvic swelling, mass and lump, unspecified site: Secondary | ICD-10-CM | POA: Insufficient documentation

## 2023-02-16 LAB — COMPREHENSIVE METABOLIC PANEL
ALT: 24 U/L (ref 0–44)
AST: 24 U/L (ref 15–41)
Albumin: 4.4 g/dL (ref 3.5–5.0)
Alkaline Phosphatase: 60 U/L (ref 38–126)
Anion gap: 8 (ref 5–15)
BUN: 14 mg/dL (ref 6–20)
CO2: 26 mmol/L (ref 22–32)
Calcium: 8.8 mg/dL — ABNORMAL LOW (ref 8.9–10.3)
Chloride: 102 mmol/L (ref 98–111)
Creatinine, Ser: 0.65 mg/dL (ref 0.44–1.00)
GFR, Estimated: >60 mL/min (ref >60)
Glucose, Bld: 91 mg/dL (ref 70–99)
Potassium: 3.5 mmol/L (ref 3.5–5.1)
Sodium: 136 mmol/L (ref 135–145)
Total Bilirubin: 0.8 mg/dL (ref 0.0–1.2)
Total Protein: 7.4 g/dL (ref 6.5–8.1)

## 2023-02-16 LAB — TYPE AND SCREEN
ABO/RH(D): O POS
Antibody Screen: NEGATIVE

## 2023-02-16 LAB — CBC
HCT: 38.3 % (ref 36.0–46.0)
Hemoglobin: 12.3 g/dL (ref 12.0–15.0)
MCH: 28.1 pg (ref 26.0–34.0)
MCHC: 32.1 g/dL (ref 30.0–36.0)
MCV: 87.6 fL (ref 80.0–100.0)
Platelets: 279 10*3/uL (ref 150–400)
RBC: 4.37 MIL/uL (ref 3.87–5.11)
RDW: 14.1 % (ref 11.5–15.5)
WBC: 6.2 10*3/uL (ref 4.0–10.5)
nRBC: 0 % (ref 0.0–0.2)

## 2023-02-26 ENCOUNTER — Telehealth: Payer: Self-pay | Admitting: *Deleted

## 2023-02-26 NOTE — Anesthesia Preprocedure Evaluation (Signed)
Anesthesia Evaluation  Patient identified by MRN, date of birth, ID band Patient awake    Reviewed: Allergy & Precautions, NPO status , Patient's Chart, lab work & pertinent test results  Airway Mallampati: II  TM Distance: >3 FB Neck ROM: Full    Dental  (+) Teeth Intact, Dental Advisory Given, Caps,    Pulmonary neg pulmonary ROS   Pulmonary exam normal breath sounds clear to auscultation       Cardiovascular hypertension, Pt. on medications Normal cardiovascular exam Rhythm:Regular Rate:Normal     Neuro/Psych negative neurological ROS  negative psych ROS   GI/Hepatic negative GI ROS, Neg liver ROS,,,  Endo/Other  Obesity   Renal/GU negative Renal ROS     Musculoskeletal negative musculoskeletal ROS (+)    Abdominal   Peds  Hematology negative hematology ROS (+)   Anesthesia Other Findings Day of surgery medications reviewed with the patient.  Reproductive/Obstetrics PELVIC MASS                             Anesthesia Physical Anesthesia Plan  ASA: 2  Anesthesia Plan: General   Post-op Pain Management: Tylenol PO (pre-op)* and Gabapentin PO (pre-op)*   Induction: Intravenous  PONV Risk Score and Plan: 4 or greater and Scopolamine patch - Pre-op, Midazolam, Dexamethasone and Ondansetron  Airway Management Planned: Oral ETT  Additional Equipment:   Intra-op Plan:   Post-operative Plan: Extubation in OR  Informed Consent: I have reviewed the patients History and Physical, chart, labs and discussed the procedure including the risks, benefits and alternatives for the proposed anesthesia with the patient or authorized representative who has indicated his/her understanding and acceptance.     Dental advisory given  Plan Discussed with: CRNA  Anesthesia Plan Comments: (2nd PIV after induction )       Anesthesia Quick Evaluation

## 2023-02-26 NOTE — Telephone Encounter (Signed)
Telephone call to check on pre-operative status.  Patient compliant with pre-operative instructions.  Reinforced nothing to eat after midnight. Clear liquids until 0415. Patient to arrive at 0515.  No questions or concerns voiced.  Instructed to call for any needs.

## 2023-02-26 NOTE — Telephone Encounter (Signed)
Attempted to reach patient for pre-op call. Left voicemail requesting call back.

## 2023-02-27 ENCOUNTER — Encounter (HOSPITAL_COMMUNITY)
Admission: RE | Disposition: A | Discharge status: Home / Self Care | Payer: Self-pay | Source: Home / Self Care | Attending: Psychiatry

## 2023-02-27 ENCOUNTER — Encounter (HOSPITAL_COMMUNITY): Payer: Self-pay | Admitting: Psychiatry

## 2023-02-27 ENCOUNTER — Other Ambulatory Visit: Payer: Self-pay

## 2023-02-27 ENCOUNTER — Ambulatory Visit (HOSPITAL_BASED_OUTPATIENT_CLINIC_OR_DEPARTMENT_OTHER): Payer: Self-pay | Admitting: Anesthesiology

## 2023-02-27 ENCOUNTER — Ambulatory Visit (HOSPITAL_COMMUNITY)
Admission: RE | Admit: 2023-02-27 | Discharge: 2023-02-27 | Disposition: A | Discharge status: Home / Self Care | Payer: 59 | Attending: Psychiatry | Admitting: Psychiatry

## 2023-02-27 ENCOUNTER — Ambulatory Visit (HOSPITAL_COMMUNITY): Payer: Self-pay | Admitting: Anesthesiology

## 2023-02-27 DIAGNOSIS — D270 Benign neoplasm of right ovary: Secondary | ICD-10-CM | POA: Insufficient documentation

## 2023-02-27 DIAGNOSIS — D259 Leiomyoma of uterus, unspecified: Secondary | ICD-10-CM

## 2023-02-27 DIAGNOSIS — E669 Obesity, unspecified: Secondary | ICD-10-CM | POA: Diagnosis not present

## 2023-02-27 DIAGNOSIS — Z6832 Body mass index (BMI) 32.0-32.9, adult: Secondary | ICD-10-CM | POA: Diagnosis not present

## 2023-02-27 DIAGNOSIS — I10 Essential (primary) hypertension: Secondary | ICD-10-CM | POA: Insufficient documentation

## 2023-02-27 DIAGNOSIS — R1900 Intra-abdominal and pelvic swelling, mass and lump, unspecified site: Secondary | ICD-10-CM | POA: Diagnosis present

## 2023-02-27 HISTORY — PX: ROBOTIC ASSISTED TOTAL HYSTERECTOMY WITH BILATERAL SALPINGO OOPHERECTOMY: SHX6086

## 2023-02-27 LAB — ABO/RH: ABO/RH(D): O POS

## 2023-02-27 LAB — POCT PREGNANCY, URINE: Preg Test, Ur: NEGATIVE

## 2023-02-27 SURGERY — HYSTERECTOMY, TOTAL, ROBOT-ASSISTED, LAPAROSCOPIC, WITH BILATERAL SALPINGO-OOPHORECTOMY
Anesthesia: General | Site: Abdomen | Laterality: Bilateral

## 2023-02-27 MED ORDER — ONDANSETRON HCL 4 MG/2ML IJ SOLN
INTRAMUSCULAR | Status: DC | PRN
  Administered 2023-02-27: 4 mg via INTRAVENOUS

## 2023-02-27 MED ORDER — HEPARIN SODIUM (PORCINE) 5000 UNIT/ML IJ SOLN
5000.0000 [IU] | INTRAMUSCULAR | Status: AC
  Administered 2023-02-27: 5000 [IU] via SUBCUTANEOUS
  Filled 2023-02-27: qty 1

## 2023-02-27 MED ORDER — BUPIVACAINE LIPOSOME 1.3 % IJ SUSP
INTRAMUSCULAR | Status: AC
  Filled 2023-02-27: qty 20

## 2023-02-27 MED ORDER — BUPIVACAINE LIPOSOME 1.3 % IJ SUSP
INTRAMUSCULAR | Status: DC | PRN
  Administered 2023-02-27: 20 mL

## 2023-02-27 MED ORDER — ONDANSETRON HCL 4 MG/2ML IJ SOLN
INTRAMUSCULAR | Status: AC
  Filled 2023-02-27: qty 2

## 2023-02-27 MED ORDER — FENTANYL CITRATE PF 50 MCG/ML IJ SOSY
PREFILLED_SYRINGE | INTRAMUSCULAR | Status: AC
  Administered 2023-02-27: 25 ug via INTRAVENOUS
  Filled 2023-02-27: qty 1

## 2023-02-27 MED ORDER — SUGAMMADEX SODIUM 200 MG/2ML IV SOLN
INTRAVENOUS | Status: DC | PRN
  Administered 2023-02-27: 20 mg via INTRAVENOUS

## 2023-02-27 MED ORDER — FENTANYL CITRATE (PF) 250 MCG/5ML IJ SOLN
INTRAMUSCULAR | Status: DC | PRN
  Administered 2023-02-27 (×2): 50 ug via INTRAVENOUS
  Administered 2023-02-27: 100 ug via INTRAVENOUS

## 2023-02-27 MED ORDER — CHLORHEXIDINE GLUCONATE 0.12 % MT SOLN
15.0000 mL | Freq: Once | OROMUCOSAL | Status: AC
  Administered 2023-02-27: 15 mL via OROMUCOSAL

## 2023-02-27 MED ORDER — ACETAMINOPHEN 500 MG PO TABS
1000.0000 mg | ORAL_TABLET | ORAL | Status: AC
  Administered 2023-02-27: 1000 mg via ORAL
  Filled 2023-02-27: qty 2

## 2023-02-27 MED ORDER — SCOPOLAMINE 1 MG/3DAYS TD PT72
1.0000 | MEDICATED_PATCH | TRANSDERMAL | Status: DC
  Administered 2023-02-27: 1.5 mg via TRANSDERMAL
  Filled 2023-02-27: qty 1

## 2023-02-27 MED ORDER — KETOROLAC TROMETHAMINE 30 MG/ML IJ SOLN
INTRAMUSCULAR | Status: DC | PRN
  Administered 2023-02-27: 15 mg via INTRAVENOUS

## 2023-02-27 MED ORDER — MIDAZOLAM HCL 5 MG/5ML IJ SOLN
INTRAMUSCULAR | Status: DC | PRN
  Administered 2023-02-27: 2 mg via INTRAVENOUS

## 2023-02-27 MED ORDER — CEFAZOLIN SODIUM-DEXTROSE 2-4 GM/100ML-% IV SOLN
2.0000 g | INTRAVENOUS | Status: AC
  Administered 2023-02-27: 2 g via INTRAVENOUS
  Filled 2023-02-27: qty 100

## 2023-02-27 MED ORDER — KETAMINE HCL 10 MG/ML IJ SOLN
INTRAMUSCULAR | Status: DC | PRN
  Administered 2023-02-27: 40 mg via INTRAVENOUS

## 2023-02-27 MED ORDER — EPHEDRINE 5 MG/ML INJ
INTRAVENOUS | Status: AC
  Filled 2023-02-27: qty 5

## 2023-02-27 MED ORDER — OXYCODONE HCL 5 MG PO TABS
ORAL_TABLET | ORAL | Status: AC
  Filled 2023-02-27: qty 1

## 2023-02-27 MED ORDER — BUPIVACAINE HCL 0.25 % IJ SOLN
INTRAMUSCULAR | Status: DC | PRN
  Administered 2023-02-27: 27 mL
  Administered 2023-02-27: 23 mL

## 2023-02-27 MED ORDER — OXYCODONE HCL 5 MG PO TABS
5.0000 mg | ORAL_TABLET | Freq: Once | ORAL | Status: AC
  Administered 2023-02-27: 5 mg via ORAL

## 2023-02-27 MED ORDER — DEXAMETHASONE SODIUM PHOSPHATE 4 MG/ML IJ SOLN: 4.0000 mg | INTRAMUSCULAR | Status: DC

## 2023-02-27 MED ORDER — PHENYLEPHRINE HCL-NACL 20-0.9 MG/250ML-% IV SOLN
INTRAVENOUS | Status: DC | PRN
  Administered 2023-02-27: 30 ug/min via INTRAVENOUS
  Administered 2023-02-27: 80 ug via INTRAVENOUS

## 2023-02-27 MED ORDER — SODIUM CHLORIDE 0.9 % IV SOLN
INTRAVENOUS | Status: DC | PRN
  Administered 2023-02-27: 50 mL via INTRAMUSCULAR

## 2023-02-27 MED ORDER — ROCURONIUM BROMIDE 10 MG/ML (PF) SYRINGE
PREFILLED_SYRINGE | INTRAVENOUS | Status: AC
  Filled 2023-02-27: qty 10

## 2023-02-27 MED ORDER — LIDOCAINE HCL (PF) 2 % IJ SOLN
INTRAMUSCULAR | Status: AC
  Filled 2023-02-27: qty 5

## 2023-02-27 MED ORDER — METRONIDAZOLE 500 MG/100ML IV SOLN
500.0000 mg | INTRAVENOUS | Status: AC
Start: 2023-02-27 — End: 2023-02-27
  Administered 2023-02-27: 500 mg via INTRAVENOUS
  Filled 2023-02-27: qty 100

## 2023-02-27 MED ORDER — GABAPENTIN 300 MG PO CAPS
300.0000 mg | ORAL_CAPSULE | ORAL | Status: AC
  Administered 2023-02-27: 300 mg via ORAL
  Filled 2023-02-27: qty 1

## 2023-02-27 MED ORDER — DEXAMETHASONE SODIUM PHOSPHATE 10 MG/ML IJ SOLN
INTRAMUSCULAR | Status: AC
  Filled 2023-02-27: qty 1

## 2023-02-27 MED ORDER — MIDAZOLAM HCL 2 MG/2ML IJ SOLN
INTRAMUSCULAR | Status: AC
  Filled 2023-02-27: qty 2

## 2023-02-27 MED ORDER — POVIDONE-IODINE 10 % EX SWAB: 2.0000 | Freq: Once | CUTANEOUS | Status: DC

## 2023-02-27 MED ORDER — FENTANYL CITRATE PF 50 MCG/ML IJ SOSY
25.0000 ug | PREFILLED_SYRINGE | INTRAMUSCULAR | Status: DC | PRN
  Administered 2023-02-27: 50 ug via INTRAVENOUS

## 2023-02-27 MED ORDER — PHENYLEPHRINE 80 MCG/ML (10ML) SYRINGE FOR IV PUSH (FOR BLOOD PRESSURE SUPPORT)
PREFILLED_SYRINGE | INTRAVENOUS | Status: AC
  Filled 2023-02-27: qty 10

## 2023-02-27 MED ORDER — PROPOFOL 10 MG/ML IV BOLUS
INTRAVENOUS | Status: DC | PRN
  Administered 2023-02-27: 160 mg via INTRAVENOUS

## 2023-02-27 MED ORDER — LACTATED RINGERS IV SOLN: INTRAVENOUS | Status: DC | PRN

## 2023-02-27 MED ORDER — KETOROLAC TROMETHAMINE 30 MG/ML IJ SOLN
INTRAMUSCULAR | Status: AC
  Filled 2023-02-27: qty 1

## 2023-02-27 MED ORDER — PROPOFOL 10 MG/ML IV BOLUS
INTRAVENOUS | Status: AC
  Filled 2023-02-27: qty 20

## 2023-02-27 MED ORDER — DEXAMETHASONE SODIUM PHOSPHATE 10 MG/ML IJ SOLN
INTRAMUSCULAR | Status: DC | PRN
  Administered 2023-02-27: 10 mg via INTRAVENOUS

## 2023-02-27 MED ORDER — ORAL CARE MOUTH RINSE: 15.0000 mL | Freq: Once | OROMUCOSAL | Status: AC

## 2023-02-27 MED ORDER — BUPIVACAINE HCL 0.25 % IJ SOLN
INTRAMUSCULAR | Status: AC
  Filled 2023-02-27: qty 1

## 2023-02-27 MED ORDER — KETAMINE HCL 50 MG/5ML IJ SOSY
PREFILLED_SYRINGE | INTRAMUSCULAR | Status: AC
  Filled 2023-02-27: qty 5

## 2023-02-27 MED ORDER — LIDOCAINE 2% (20 MG/ML) 5 ML SYRINGE
INTRAMUSCULAR | Status: DC | PRN
  Administered 2023-02-27: 80 mg via INTRAVENOUS

## 2023-02-27 MED ORDER — EPHEDRINE SULFATE-NACL 50-0.9 MG/10ML-% IV SOSY
PREFILLED_SYRINGE | INTRAVENOUS | Status: DC | PRN
  Administered 2023-02-27: 5 mg via INTRAVENOUS

## 2023-02-27 MED ORDER — ROCURONIUM BROMIDE 10 MG/ML (PF) SYRINGE
PREFILLED_SYRINGE | INTRAVENOUS | Status: DC | PRN
  Administered 2023-02-27 (×2): 20 mg via INTRAVENOUS
  Administered 2023-02-27: 60 mg via INTRAVENOUS

## 2023-02-27 MED ORDER — STERILE WATER FOR IRRIGATION IR SOLN
Status: DC | PRN
  Administered 2023-02-27: 1000 mL

## 2023-02-27 MED ORDER — AMISULPRIDE (ANTIEMETIC) 5 MG/2ML IV SOLN
10.0000 mg | Freq: Once | INTRAVENOUS | Status: DC | PRN
Start: 2023-02-27 — End: 2023-02-27

## 2023-02-27 MED ORDER — LACTATED RINGERS IR SOLN
Status: DC | PRN
  Administered 2023-02-27: 1000 mL

## 2023-02-27 MED ORDER — ONDANSETRON HCL 4 MG/2ML IJ SOLN: 4.0000 mg | Freq: Once | INTRAMUSCULAR | Status: DC | PRN

## 2023-02-27 MED ORDER — LACTATED RINGERS IV SOLN: INTRAVENOUS | Status: DC

## 2023-02-27 MED ORDER — FENTANYL CITRATE (PF) 250 MCG/5ML IJ SOLN
INTRAMUSCULAR | Status: AC
  Filled 2023-02-27: qty 5

## 2023-02-27 SURGICAL SUPPLY — 73 items
APPLICATOR SURGIFLO ENDO (HEMOSTASIS) IMPLANT
BAG LAPAROSCOPIC 12 15 PORT 16 (BASKET) IMPLANT
BAG RETRIEVAL 12/15 (BASKET) ×1 IMPLANT
BINDER ABDOMINAL 12 ML 46-62 (SOFTGOODS) IMPLANT
BLADE SURG SZ10 CARB STEEL (BLADE) IMPLANT
COVER BACK TABLE 60X90IN (DRAPES) ×1 IMPLANT
COVER TIP SHEARS 8 DVNC (MISCELLANEOUS) ×1 IMPLANT
DERMABOND ADVANCED .7 DNX12 (GAUZE/BANDAGES/DRESSINGS) ×1 IMPLANT
DRAPE ARM DVNC X/XI (DISPOSABLE) ×4 IMPLANT
DRAPE COLUMN DVNC XI (DISPOSABLE) ×1 IMPLANT
DRAPE SHEET LG 3/4 BI-LAMINATE (DRAPES) ×1 IMPLANT
DRAPE SURG IRRIG POUCH 19X23 (DRAPES) ×1 IMPLANT
DRIVER NDL MEGA SUTCUT DVNCXI (INSTRUMENTS) ×1 IMPLANT
DRIVER NDLE MEGA SUTCUT DVNCXI (INSTRUMENTS) ×1 IMPLANT
DRSG OPSITE POSTOP 4X6 (GAUZE/BANDAGES/DRESSINGS) IMPLANT
DRSG OPSITE POSTOP 4X8 (GAUZE/BANDAGES/DRESSINGS) IMPLANT
ELECT PENCIL ROCKER SW 15FT (MISCELLANEOUS) IMPLANT
ELECT REM PT RETURN 15FT ADLT (MISCELLANEOUS) ×1 IMPLANT
FORCEPS BPLR FENES DVNC XI (FORCEP) ×1 IMPLANT
FORCEPS PROGRASP DVNC XI (FORCEP) ×1 IMPLANT
GAUZE 4X4 16PLY ~~LOC~~+RFID DBL (SPONGE) ×1 IMPLANT
GLOVE BIO SURGEON STRL SZ 6 (GLOVE) ×4 IMPLANT
GLOVE BIO SURGEON STRL SZ 6.5 (GLOVE) ×1 IMPLANT
GLOVE BIOGEL PI IND STRL 6.5 (GLOVE) ×2 IMPLANT
GOWN STRL REUS W/ TWL LRG LVL3 (GOWN DISPOSABLE) ×4 IMPLANT
GRASPER SUT TROCAR 14GX15 (MISCELLANEOUS) IMPLANT
HOLDER FOLEY CATH W/STRAP (MISCELLANEOUS) IMPLANT
IRRIG SUCT STRYKERFLOW 2 WTIP (MISCELLANEOUS) ×1 IMPLANT
IRRIGATION SUCT STRKRFLW 2 WTP (MISCELLANEOUS) ×1 IMPLANT
KIT PROCEDURE DVNC SI (MISCELLANEOUS) IMPLANT
KIT TURNOVER KIT A (KITS) IMPLANT
LIGASURE IMPACT 36 18CM CVD LR (INSTRUMENTS) IMPLANT
MANIPULATOR ADVINCU DEL 3.0 PL (MISCELLANEOUS) IMPLANT
MANIPULATOR ADVINCU DEL 3.5 PL (MISCELLANEOUS) IMPLANT
MANIPULATOR ADVINCU DEL 4.0 PL (MISCELLANEOUS) IMPLANT
MANIPULATOR UTERINE 4.5 ZUMI (MISCELLANEOUS) IMPLANT
NDL HYPO 21X1.5 SAFETY (NEEDLE) ×1 IMPLANT
NDL INSUFFLATION 14GA 120MM (NEEDLE) IMPLANT
NDL SPNL 20GX3.5 QUINCKE YW (NEEDLE) IMPLANT
NEEDLE HYPO 21X1.5 SAFETY (NEEDLE) ×1 IMPLANT
NEEDLE INSUFFLATION 14GA 120MM (NEEDLE) IMPLANT
NEEDLE SPNL 20GX3.5 QUINCKE YW (NEEDLE) IMPLANT
OBTURATOR OPTICAL STND 8 DVNC (TROCAR) ×1 IMPLANT
OBTURATOR OPTICALSTD 8 DVNC (TROCAR) ×1 IMPLANT
PACK ROBOT GYN CUSTOM WL (TRAY / TRAY PROCEDURE) ×1 IMPLANT
PAD ARMBOARD 7.5X6 YLW CONV (MISCELLANEOUS) ×1 IMPLANT
PAD POSITIONING PINK XL (MISCELLANEOUS) ×1 IMPLANT
PORT ACCESS TROCAR AIRSEAL 12 (TROCAR) IMPLANT
SCISSORS MNPLR CVD DVNC XI (INSTRUMENTS) ×1 IMPLANT
SCRUB CHG 4% DYNA-HEX 4OZ (MISCELLANEOUS) ×2 IMPLANT
SEAL UNIV 5-12 XI (MISCELLANEOUS) ×4 IMPLANT
SET TRI-LUMEN FLTR TB AIRSEAL (TUBING) ×1 IMPLANT
SPIKE FLUID TRANSFER (MISCELLANEOUS) ×1 IMPLANT
SPONGE T-LAP 18X18 ~~LOC~~+RFID (SPONGE) IMPLANT
SURGIFLO W/THROMBIN 8M KIT (HEMOSTASIS) IMPLANT
SUT MNCRL AB 4-0 PS2 18 (SUTURE) IMPLANT
SUT PDS AB 1 TP1 54 (SUTURE) IMPLANT
SUT VIC AB 0 CT1 27XBRD ANTBC (SUTURE) IMPLANT
SUT VIC AB 2-0 CT1 TAPERPNT 27 (SUTURE) IMPLANT
SUT VIC AB 2-0 SH 27XBRD (SUTURE) IMPLANT
SUT VIC AB 4-0 PS2 18 (SUTURE) ×2 IMPLANT
SUT VICRYL 0 27 CT2 27 ABS (SUTURE) ×1 IMPLANT
SYR 10ML LL (SYRINGE) IMPLANT
SYS BAG RETRIEVAL 10MM (BASKET) IMPLANT
SYS WOUND ALEXIS 18CM MED (MISCELLANEOUS) IMPLANT
SYSTEM BAG RETRIEVAL 10MM (BASKET) IMPLANT
SYSTEM WOUND ALEXIS 18CM MED (MISCELLANEOUS) IMPLANT
TRAP SPECIMEN MUCUS 40CC (MISCELLANEOUS) IMPLANT
TRAY FOLEY MTR SLVR 16FR STAT (SET/KITS/TRAYS/PACK) ×1 IMPLANT
TROCAR PORT AIRSEAL 5X120 (TROCAR) IMPLANT
UNDERPAD 30X36 HEAVY ABSORB (UNDERPADS AND DIAPERS) ×2 IMPLANT
WATER STERILE IRR 1000ML POUR (IV SOLUTION) ×1 IMPLANT
YANKAUER SUCT BULB TIP 10FT TU (MISCELLANEOUS) IMPLANT

## 2023-02-27 NOTE — Discharge Instructions (Addendum)
AFTER SURGERY INSTRUCTIONS   Return to work: 4-6 weeks if applicable  You may or may not have a white honeycomb dressing over your larger incision. If present, this dressing can be removed 5 days after surgery and you do not need to reapply a new dressing. Once you remove the dressing, you will notice that you have the surgical glue (dermabond) on the incision and this will peel off on its own. You can get this dressing wet in the shower the days after surgery prior to removal on the 5th day.    Activity: 1. Be up and out of the bed during the day.  Take a nap if needed.  You may walk up steps but be careful and use the hand rail.  Stair climbing will tire you more than you think, you may need to stop part way and rest.    2. No lifting or straining for 6 weeks over 10 pounds. No pushing, pulling, straining for 6 weeks.   3. No driving for 1-61 days when the following criteria have been met: Do not drive if you are taking narcotic pain medicine and make sure that your reaction time has returned.    4. You can shower as soon as the next day after surgery. Shower daily.  Use your regular soap and water (not directly on the incision) and pat your incision(s) dry afterwards; don't rub.  No tub baths or submerging your body in water until cleared by your surgeon. If you have the soap that was given to you by pre-surgical testing that was used before surgery, you do not need to use it afterwards because this can irritate your incisions.    5. No sexual activity and nothing in the vagina for 12 weeks.   6. You may experience a small amount of clear drainage from your incisions, which is normal.  If the drainage persists, increases, or changes color please call the office.   7. Do not use creams, lotions, or ointments such as neosporin on your incisions after surgery until advised by your surgeon because they can cause removal of the dermabond glue on your incisions.     8. You may experience vaginal  spotting after surgery or when the stitches at the top of the vagina begin to dissolve.  The spotting is normal but if you experience heavy bleeding, call our office.   9. Take Tylenol or ibuprofen first for pain if you are able to take these medications and only use narcotic pain medication for severe pain not relieved by the Tylenol or Ibuprofen.  Monitor your Tylenol intake to a max of 4,000 mg in a 24 hour period. You can alternate these medications after surgery.   Diet: 1. Low sodium Heart Healthy Diet is recommended but you are cleared to resume your normal (before surgery) diet after your procedure.   2. It is safe to use a laxative, such as Miralax or Colace, if you have difficulty moving your bowels before surgery. You have been prescribed Sennakot-S to take at bedtime every evening after surgery to keep bowel movements regular and to prevent constipation.     Wound Care: 1. Keep clean and dry.  Shower daily.   Reasons to call the Doctor: Fever - Oral temperature greater than 100.4 degrees Fahrenheit Foul-smelling vaginal discharge Difficulty urinating Nausea and vomiting Increased pain at the site of the incision that is unrelieved with pain medicine. Difficulty breathing with or without chest pain New calf pain especially if  only on one side Sudden, continuing increased vaginal bleeding with or without clots.   Contacts: For questions or concerns you should contact:   Dr. Clide Cliff at [phone]   Warner Mccreedy, NP at [phone]   After Hours: call [phone] and have the GYN Oncologist paged/contacted (after 5 pm or on the weekends). You will speak with an after hours RN and let he or she know you have had surgery.   Messages sent via mychart are for non-urgent matters and are not responded to after hours so for urgent needs, please call the after hours number.

## 2023-02-27 NOTE — Transfer of Care (Signed)
Immediate Anesthesia Transfer of Care Note  Patient: [facility]  Procedure(s) Performed: XI ROBOTIC ASSISTED TOTAL HYSTERECTOMY WITH BILATERAL SALPINGO OOPHORECTOMY,MINI LAPARTOMY (Bilateral: Abdomen)  Patient Location: PACU  Anesthesia Type:General  Level of Consciousness: drowsy and responds to stimulation  Airway & Oxygen Therapy: Patient Spontanous Breathing and Patient connected to face mask oxygen  Post-op Assessment: Report given to RN and Post -op Vital signs reviewed and stable  Post vital signs: Reviewed and stable  Last Vitals:  Vitals Value Taken Time  BP 128/94 02/27/23 1024  Temp    Pulse 77 02/27/23 1029  Resp 14 02/27/23 1029  SpO2 100 % 02/27/23 1029  Vitals shown include unfiled device data.  Last Pain:  Vitals:   02/27/23 0528  PainSc: 0-No pain         Complications: No notable events documented.

## 2023-02-27 NOTE — Interval H&P Note (Signed)
History and Physical Interval Note:  02/27/2023 7:19 AM  [facility]  has presented today for surgery, with the diagnosis of PELVIC MASS.  The various methods of treatment have been discussed with the patient and family. After consideration of risks, benefits and other options for treatment, the patient has consented to  Procedure(s): XI ROBOTIC ASSISTED TOTAL HYSTERECTOMY WITH BILATERAL SALPINGO OOPHORECTOMY,MINI LAPARTOMY,POSSIBLE LAPARTOMY (Bilateral) as a surgical intervention.  The patient's history has been reviewed, patient examined, no change in status, stable for surgery.  I have reviewed the patient's chart and labs.  Questions were answered to the patient's satisfaction.     [name]

## 2023-02-27 NOTE — Anesthesia Procedure Notes (Signed)
Procedure Name: Intubation Date/Time: 02/27/2023 7:34 AM  Performed by: Ammie Dalton, CRNAPre-anesthesia Checklist: Patient identified, Emergency Drugs available, Suction available and Patient being monitored Patient Re-evaluated:Patient Re-evaluated prior to induction Oxygen Delivery Method: Circle System Utilized Preoxygenation: Pre-oxygenation with 100% oxygen Induction Type: IV induction Ventilation: Mask ventilation without difficulty Laryngoscope Size: 3 Grade View: Grade II Tube type: Oral Number of attempts: 1 Airway Equipment and Method: Stylet Placement Confirmation: ETT inserted through vocal cords under direct vision, positive ETCO2 and breath sounds checked- equal and bilateral Secured at: 21 cm Tube secured with: Tape Dental Injury: Teeth and Oropharynx as per pre-operative assessment

## 2023-02-27 NOTE — Anesthesia Postprocedure Evaluation (Signed)
Anesthesia Post Note  Patient: [facility]  Procedure(s) Performed: XI ROBOTIC ASSISTED TOTAL HYSTERECTOMY WITH BILATERAL SALPINGO OOPHORECTOMY,MINI LAPARTOMY (Bilateral: Abdomen)     Patient location during evaluation: PACU Anesthesia Type: General Level of consciousness: awake and alert Pain management: pain level controlled Vital Signs Assessment: post-procedure vital signs reviewed and stable Respiratory status: spontaneous breathing, nonlabored ventilation and respiratory function stable Cardiovascular status: blood pressure returned to baseline and stable Postop Assessment: no apparent nausea or vomiting Anesthetic complications: no   No notable events documented.  Last Vitals:  Vitals:   02/27/23 1239 02/27/23 1245  BP: (!) 146/97 (!) 140/86  Pulse: 66 88  Resp: 16   Temp: 36.4 C   SpO2: 99% 100%    Last Pain:  Vitals:   02/27/23 1239  TempSrc: Oral  PainSc: 5                  Collene Schlichter

## 2023-02-27 NOTE — Op Note (Signed)
GYNECOLOGIC ONCOLOGY OPERATIVE NOTE  Date of Service: 02/27/2023  Preoperative Diagnosis: Solid pelvic mass, uterine fibroids  Postoperative Diagnosis: Right ovarian fibroma  Procedures: Robotic-assisted total laparoscopic hysterectomy, bilateral salpingo-oophorectomy, minilaparotomy for specimen retrieval  Surgeon: Clide Cliff, MD  Assistants: Antionette Char, MD and (an MD assistant was necessary for tissue manipulation, management of robotic instrumentation, retraction and positioning due to the complexity of the case and hospital policies)  Anesthesia: General  Estimated Blood Loss: 50 mL   Fluids: 1000 ml, crystalloid  Urine Output: 500 ml, clear yellow  Findings: On entry to abdomen, normal upper abdominal survey with smooth diaphragm, liver, stomach and normal appearing omentum and bowel. Solid mass arising from the right ovary, likely consistent with an ovarian fibroma. Otherwise normal uterus, left fallopian tube and ovary. IOFS c/w ovarian fibroma.   Specimens:  ID Type Source Tests Collected by Time Destination  1 : RIGHT TUBE AND OVARY Tissue PATH Gyn tumor resection SURGICAL PATHOLOGY Clide Cliff, MD 02/27/2023 [phone]   2 : Uterus, Cervix, Left Tube and Ovary Tissue PATH Gyn tumor resection SURGICAL PATHOLOGY Clide Cliff, MD 02/27/2023 [phone]   A : PELVIC WASHINGS GYN Other Source CYTOLOGY - NON PAP Clide Cliff, MD 02/27/2023 [phone]     Complications:  None  Indications for Procedure: Selena Collins is a 53 y.o. woman with a solid pelvic mass of unclear origin (ovarian versus uterine).  Prior to the procedure, all risks, benefits, and alternatives were discussed and informed surgical consent was signed.  Procedure: Patient was taken to the operating room where general anesthesia was achieved.  She was positioned in dorsal lithotomy and prepped and draped.  A foley catheter was inserted into the bladder. The cervix was dilated and an Advincula uterine  manipulator with a colpotomy ring was inserted into the uterus.  A 5 mm incision was made in the left upper quadrant near Palmer's point.  The abdomen was entered with a 5 mm OptiView trocar under direct visualization.  The abdomen was insufflated, the patient placed in steep Trendelenburg, and additional trocars were placed as follows: an 8mm trocar superior to the umbilicus, one 8 mm robotic trocar in the right abdomen, and one 8 mm robotic trocar in the left abdomen.  The left upper quadrant trocar was removed and replaced with a 5 mm airseal trocar.  All trocars were placed under direct visualization.  The bowels were moved into the upper abdomen.  The DaVinci robotic surgical system was brought to the patient's bedside and docked.  Pelvic washings were collected. The right round ligament was transected and the retroperitoneum entered.  The right ureter was identified. The right infundibulopelvic ligament was isolated, cauterized, and transected. The posterior peritoneum was opened to the KOH ring.  The anterior peritoneum was opened and the bladder flap was created.  The right uterine artery was skeletonized, cauterized, and transected at the level of the KOH ring. Additional cautery was used in a C-shaped fashion to allow the remainder of the broad, cardinal, and uterosacral ligaments with the uterine vessels to be transected and fall away from the KOH ring. A similar procedure was performed on the left side.  A colpotomy was made circumferentially following the contours of the KOH ring.  The right fallopian tube and utero-ovarian ligament were cauterized and transected to separate the right adnexa from the uterine specimen as it was too large for removal from the vagina. The uterine specimen was removed through the vagina.  The right adnexa was then placed  in an endocatch bag and left in the upper abdomen for later retrieval.   The vaginal cuff was closed with a running stitch of 0 Vicryl suture.  The  pelvis was irrigated and all operative sites were found to be hemostatic.  All instruments were removed and the robot was taken from the patient's bedside.   With insufflation in place , a Pfannenstiel incision was made 2 cm above the pubic symphysis. The skin was incised with a scalpel and the incision was taken down to the level of the fascia with monopolar energy. The fascia was incised and the incision was carried laterally with monopolar energy on cut current. The fascia was tented up and dissected off the underlying rectus muscles. The rectus muscles were separated in the midline and the peritoneum was tented up and entered sharply. The peritoneal incision was extended bluntly. The abdomen was desufflated. The endocatch bag was grasped and pulled out through the incision. The skin was extended a few mm on each side to allow for removal of endocatch bag with specimen. This was handed off the field for frozen pathology which returned benign.  The fascia of the pfannenstiel incision was closed with a running stitch of #1 PDS. The subcutaneous space was irrigated and made hemostatic. Exparel was infiltrated in standard fashion. The subcutaneous space was reapproximated with a running stitch of 2-0 vicryl followed by a deep dermal running stitch of 4-0 vicryl. The skin was then closed with 4-0 monocryl in a subcuticular fashion followed by dermabond.  All ports were removed. The skin at all laparoscopic incisions was closed with 4-0 Vicryl to reapproximate the subcutaneous tissue and 4-0 monocryl in a subcuticular fashion followed by surgical glue.  Patient tolerated the procedure well. Sponge, lap, and instrument counts were correct.  Patient received 2 gm of Ancef and 500mg  of metronidazole prior to skin incision for routine perioperative antibiotic prophylaxis.  She was extubated and taken to the PACU in stable condition.  Clide Cliff, MD Gynecologic Oncology

## 2023-02-28 ENCOUNTER — Encounter (HOSPITAL_COMMUNITY): Payer: Self-pay | Admitting: Psychiatry

## 2023-02-28 ENCOUNTER — Telehealth: Payer: Self-pay | Admitting: *Deleted

## 2023-02-28 LAB — CYTOLOGY - NON PAP

## 2023-02-28 LAB — SURGICAL PATHOLOGY

## 2023-02-28 NOTE — Telephone Encounter (Signed)
Spoke with Selena Collins this morning. She states she is eating, drinking and urinating well. She has not had a BM yet but is passing gas. She is taking senokot as prescribed and encouraged her to drink plenty of water. She denies fever or chills. Incisions are dry and intact. She rates her pain 4/10. Her pain is controlled with tylenol today, pt states she took tramadol yesterday.     Instructed to call office with any fever, chills, purulent drainage, uncontrolled pain or any other questions or concerns. Patient verbalizes understanding.   Pt aware of post op appointments as well as the office number [phone] and after hours number [phone] to call if she has any questions or concerns.  Pt wants to relay, "Tell Dr. Alvester Morin Thank you"

## 2023-03-03 NOTE — Interval H&P Note (Signed)
History and Physical Interval Note:   Selena Collins  has presented today for surgery, with the diagnosis of PELVIC MASS.  The various methods of treatment have been discussed with the patient and family. After consideration of risks, benefits and other options for treatment, the patient has consented to  Procedure(s): XI ROBOTIC ASSISTED TOTAL HYSTERECTOMY WITH BILATERAL SALPINGO OOPHORECTOMY,MINI LAPARTOMY (Bilateral) as a surgical intervention.  The patient's history has been reviewed, patient examined, no change in status, stable for surgery.  I have reviewed the patient's chart and labs.  Questions were answered to the patient's satisfaction.     Phyllis Whitefield

## 2023-03-05 ENCOUNTER — Encounter: Payer: Self-pay | Admitting: Psychiatry

## 2023-03-12 ENCOUNTER — Encounter: Payer: Self-pay | Admitting: Psychiatry

## 2023-03-12 ENCOUNTER — Inpatient Hospital Stay: Payer: 59 | Attending: Psychiatry | Admitting: Psychiatry

## 2023-03-12 VITALS — BP 136/77 | HR 80 | Temp 98.6°F | Resp 20 | Wt 188.2 lb

## 2023-03-12 DIAGNOSIS — Z90710 Acquired absence of both cervix and uterus: Secondary | ICD-10-CM

## 2023-03-12 DIAGNOSIS — R1900 Intra-abdominal and pelvic swelling, mass and lump, unspecified site: Secondary | ICD-10-CM

## 2023-03-12 DIAGNOSIS — E8941 Symptomatic postprocedural ovarian failure: Secondary | ICD-10-CM

## 2023-03-12 DIAGNOSIS — Z90722 Acquired absence of ovaries, bilateral: Secondary | ICD-10-CM

## 2023-03-12 DIAGNOSIS — Z9079 Acquired absence of other genital organ(s): Secondary | ICD-10-CM

## 2023-03-12 DIAGNOSIS — D270 Benign neoplasm of right ovary: Secondary | ICD-10-CM

## 2023-03-12 MED ORDER — ESTRADIOL 0.05 MG/24HR TD PTWK: 0.0500 mg | MEDICATED_PATCH | TRANSDERMAL | 1 refills | Status: AC

## 2023-03-12 NOTE — Patient Instructions (Signed)
 It was a pleasure to see you in clinic today. - No lifting >10lbs until 6 weeks postop. Nothing in the vagina until 10 weeks postop. - Okay to return to routine care. Recommend that you reach out to your Ob/Gyn to discuss ongoing treatment for hotflashes.  Thank you very much for allowing me to provide care for you today.  I appreciate your confidence in choosing our Gynecologic Oncology team at [facility].  If you have any questions about your visit today please call our office or send us  a MyChart message and we will get back to you as soon as possible.

## 2023-03-12 NOTE — Progress Notes (Signed)
 Gynecologic Oncology Return Clinic Visit  Date of Service: 03/12/2023 Referring Provider: Thurman Flores, MD   Assessment & Plan: Selena Collins is a 53 y.o. woman with a solid pelvic mass who is s/p TRH, BSO, minilap for specimen removal on 02/27/23 with final path showing ovarian fibroma.  Postop: - Pt recovering well from surgery and healing appropriately postoperatively - Intraoperative findings and pathology results reviewed. - Ongoing postoperative expectations and precautions reviewed. Continue with no lifting >10lbs through 6 weeks postoperatively - Okay to return to work at 6 weeks. - Okay to resume routine care. No further pap smears needed unless known history of high grade dysplasia.   Hot flashes: - Reviewed options for treatment.  - Pt desires estrogen patch. Short script sent to bridge until she can follow-up with Ob/Gyn.   RTC prn.  Derrel Flies, MD Gynecologic Oncology     ----------------------- Reason for Visit: Postop   Interval History: Pt reports that she is recovering well from surgery. She is using tylenol , ibuprofen prn for pain. She is eating and drinking well. She is voiding without issue and having regular bowel movements. Denies bleeding.   Past Medical/Surgical History: Past Medical History:  Diagnosis Date   Hypertension    Meningitis 1988    Past Surgical History:  Procedure Laterality Date   LIPOSUCTION  2017   abdominal   ROBOTIC ASSISTED TOTAL HYSTERECTOMY WITH BILATERAL SALPINGO OOPHERECTOMY Bilateral 02/27/2023   Procedure: XI ROBOTIC ASSISTED TOTAL HYSTERECTOMY WITH BILATERAL SALPINGO OOPHORECTOMY,MINI LAPARTOMY;  Surgeon: Derrel Flies, MD;  Location: [facility];  Service: Gynecology;  Laterality: Bilateral;   spinal tab      Family History  Problem Relation Age of Onset   Urticaria Mother    Other Mother        Brain tumor but may have been benign   Heart failure Father 16   Hypertension Father    Allergic rhinitis  Sister    Hypertension Sister    Breast cancer Maternal Grandmother 79   Breast cancer Paternal Aunt        31's at age of dx   Cervical cancer Cousin    Ovarian cancer Neg Hx    Uterine cancer Neg Hx     Social History   Socioeconomic History   Marital status: Married    Spouse name: Not on file   Number of children: 2   Years of education: Not on file   Highest education level: Not on file  Occupational History   Not on file  Tobacco Use   Smoking status: Never   Smokeless tobacco: Never  Vaping Use   Vaping status: Never Used  Substance and Sexual Activity   Alcohol use: Yes    Comment: social monthly   Drug use: Never   Sexual activity: Yes  Other Topics Concern   Not on file  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: Not on file  Food Insecurity: No Food Insecurity (01/26/2023)   Hunger Vital Sign    Worried About Running Out of Food in the Last Year: Never true    Ran Out of Food in the Last Year: Never true  Transportation Needs: No Transportation Needs (01/26/2023)   PRAPARE - Administrator, Civil Service (Medical): No    Lack of Transportation (Non-Medical): No  Physical Activity: Not on file  Stress: Not on file  Social Connections: Not on file    Current  Medications:  Current Outpatient Medications:    amLODipine  (NORVASC ) 5 MG tablet, Take 5 mg by mouth daily., Disp: , Rfl:    atorvastatin (LIPITOR) 10 MG tablet, Take 10 mg by mouth daily., Disp: , Rfl:    fluticasone  (FLONASE ) 50 MCG/ACT nasal spray, Place 1 spray into both nostrils daily. (Patient taking differently: Place 1 spray into both nostrils daily as needed for allergies.), Disp: 16 g, Rfl: 0   hydroxypropyl methylcellulose / hypromellose (ISOPTO TEARS / GONIOVISC) 2.5 % ophthalmic solution, Place 1 drop into both eyes as needed for dry eyes., Disp: , Rfl:    ibuprofen (ADVIL) 200 MG tablet, Take 400 mg by mouth every 6  (six) hours as needed for moderate pain (pain score 4-6) or headache., Disp: , Rfl:    ondansetron  (ZOFRAN -ODT) 4 MG disintegrating tablet, 4mg  ODT q4 hours prn nausea/vomit, Disp: 10 tablet, Rfl: 0   senna-docusate (SENOKOT-S) 8.6-50 MG tablet, Take 2 tablets by mouth at bedtime., Disp: 30 tablet, Rfl: 0   traMADol  (ULTRAM ) 50 MG tablet, Take 1 tablet (50 mg total) by mouth every 6 (six) hours as needed for severe pain (pain score 7-10). For AFTER surgery only, do not take and drive, Disp: 10 tablet, Rfl: 0  Review of Symptoms: Complete 10-system review is positive for: bruising, pelvic soreness, fatigue, hot flashes  Physical Exam: BP 136/77 (BP Location: Left Arm, Patient Position: Sitting)   Pulse 80   Temp 98.6 F (37 C) (Oral)   Resp 20   Wt 188 lb 3.2 oz (85.4 kg)   SpO2 100%   BMI 33.08 kg/m  General: Alert, oriented, no acute distress. HEENT: Normocephalic, atraumatic. Neck symmetric without masses. Sclera anicteric.  Chest: Normal work of breathing. Clear to auscultation bilaterally.   Cardiovascular: Regular rate and rhythm, no murmurs. Abdomen: Soft, nontender.  Normoactive bowel sounds.  No masses appreciated.  Well-healing incisions. Dressing removed from pfannenstiel incision. Few small areas of firmness underlying the pfannenstiel incision. No erythema,drainage, fluctuance. Extremities: Grossly normal range of motion.  Warm, well perfused.  No edema bilaterally. Skin: No rashes or lesions noted. GU: Normal appearing external genitalia without erythema, excoriation, or lesions.  Speculum exam reveals well healing intact vaginal cuff.  Bimanual exam reveals intact vaginal cuff. Exam chaperoned by Kimberly [place], CMA   Laboratory & Radiologic Studies: Surgical pathology (02/27/23): A.   FALLOPIAN TUBE, OVARY, RIGHT, SALPINGO OOPHORECTOMY:  Right ovary      Benign ovarian fibroma, 12.2 cm      Negative for malignancy  Right fallopian tube      Benign fallopian tube       Negative for endometriosis or malignancy   B.   UTERUS, CERVIX, FALLOPIAN TUBE, OVARY, LEFT, HYSTERECTOMY:  Cervix     Unremarkable      Negative for dysplasia  Endometrium     Inactive      Negative for endometrial intraepithelial neoplasia (EIN) or  malignancy  Myometrium     Leiomyomata      Negative for malignancy  Left ovary      Unremarkable      Negative for endometriosis or malignancy  Left fallopian tube      Unremarkable      Negative for endometriosis or malignancy

## 2023-03-28 ENCOUNTER — Telehealth: Payer: Self-pay | Admitting: *Deleted

## 2023-03-28 NOTE — Telephone Encounter (Signed)
 Patient called and stated "I had surgery with Dr Alvester Morin on 1/28. About a week ago I started having pain on the left upper part of my chest under the ribs. Is this part of the surgery? It is worse after dinner and at night. It does wake me up at night. I have being taking Tylenol and Advil. At night if I sit up for a while it helps. The pain level  is about a 3-4.  I have not done any heavy lifting, pushing or pulling."   Explained that the message would be given to the provider and the office would call her back later today.

## 2023-03-28 NOTE — Telephone Encounter (Signed)
 I spoke to Selena Collins and relayed message from [facility] NP.  she states the pain is above the left incision.under her breast. Pain is better when she is up moving around. No SOB/no heart racing. Reports having normal BM's (still taking Senakot) she is passing gas. No fever/chills,eating/drinking ok. Pt states she will follow up with her PCP and maybe get a GI referral.
